# Patient Record
Sex: Male | Born: 1965 | Race: White | Hispanic: No | Marital: Married | Smoking: Former smoker
Health system: Southern US, Community
[De-identification: ages and names within clinical notes are randomized; demographics above are authoritative.]

## PROBLEM LIST (undated history)

## (undated) DIAGNOSIS — J302 Other seasonal allergic rhinitis: Secondary | ICD-10-CM

## (undated) DIAGNOSIS — T8859XA Other complications of anesthesia, initial encounter: Secondary | ICD-10-CM

## (undated) DIAGNOSIS — M1990 Unspecified osteoarthritis, unspecified site: Secondary | ICD-10-CM

## (undated) DIAGNOSIS — E119 Type 2 diabetes mellitus without complications: Secondary | ICD-10-CM

## (undated) DIAGNOSIS — T4145XA Adverse effect of unspecified anesthetic, initial encounter: Secondary | ICD-10-CM

## (undated) DIAGNOSIS — J45909 Unspecified asthma, uncomplicated: Secondary | ICD-10-CM

## (undated) DIAGNOSIS — M25512 Pain in left shoulder: Secondary | ICD-10-CM

## (undated) DIAGNOSIS — Z9889 Other specified postprocedural states: Secondary | ICD-10-CM

## (undated) DIAGNOSIS — K219 Gastro-esophageal reflux disease without esophagitis: Secondary | ICD-10-CM

## (undated) DIAGNOSIS — R112 Nausea with vomiting, unspecified: Secondary | ICD-10-CM

## (undated) HISTORY — PX: NASAL SEPTUM SURGERY: SHX37

## (undated) HISTORY — PX: KNEE ARTHROSCOPY: SUR90

---
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
## 2014-11-07 ENCOUNTER — Other Ambulatory Visit: Payer: Self-pay | Admitting: Physician Assistant

## 2014-11-07 ENCOUNTER — Encounter (HOSPITAL_BASED_OUTPATIENT_CLINIC_OR_DEPARTMENT_OTHER): Payer: Self-pay | Admitting: *Deleted

## 2014-11-07 NOTE — H&P (Signed)
Wesley Castillo is a new patient to the office.  Presents for a self solicited second opinion for issues with his left knee.  I have reviewed previous notes he has provided, as well as some x-rays he has provided.  I have no previous operative notes.  This is a healthy very active 49 year-old.  Presents for gradual worsening symptoms, left knee, over a number of years.  He had a traumatic injury with a remote medial meniscectomy on the left.  Subsequent development of significant progressive arthritis medial compartment.  He had another procedure mid 2014 with arthroscopy, debridement and microfracturing medial compartment.  That helped a little bit, but not very long.  He has now progressed with rest pain, night pain and marked functional impact.  Pain with all activities.  Increasing deformity.  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  He has tried an unloading brace which is just not tolerable at all.  He has been through Cortisone injections which have helped a little bit for a short time, but nothing more dramatic.  He has been told that he is going in the direction of joint replacement.  Although he has had numerous injections, I am not sure whether he has had Visco supplementation or not.  Oral anti-inflammatories do help to an extent.  A discussion with him has been undertaken about replacement as opposed to high tibial osteotomy.  He presents at this time for my input, treatment recommendation and to continue with his care.   Remaining history reviewed.  Otherwise in great health.  He has a little bit of soreness medial side of his right knee, but not that dramatic.     EXAMINATION: General exam is outlined and included in the chart.  Specifically, healthy appearing 49 year-old.  Height: 5?11.  Weight: 218 pounds.  Antalgic gait, varus thrust on the left.  Negative log roll of both hips.  Negative straight leg raise, both sides.  He has 2+ patellofemoral crepitus, both sides.  Reasonable Q angle and  not a lot of pain there.  On the right alignment is good.  No meniscal signs.  Ligaments stable.  On the left he has 5 degrees of varus that is correctable, but he still has full extension and 125 degrees of flexion.  All ligaments feel stable.  No tenderness.  No soreness laterally.  Neurovascularly intact distally.    X-RAYS: Four view standing x-ray reveals bone on bone medial compartment on the left.  A little bit of change patellofemoral joint, both sides, not too extreme.  Lateral compartment looks good on the left.  50% narrowing medial compartment on the right, certainly not bone on bone.  A unloading stress view of the left knee shows that the varus alignment and medial compartment can be opened.  The lateral side did not close.    DISPOSITION:  An extensive amount of time spent with him face-to-face concerning his dilemma, his age and options.  At this point in time I think the only viable option is replacement arthroplasty.  I am not at all in favor of a high tibial osteotomy in this setting.  I think he is a good candidate for a medial unicompartmental replacement rather than total knee replacement.  I discussed the pros and cons of both these approaches.  Procedure, risks, benefits and complications reviewed in detail.  A uni medial replacement can be done on an outpatient basis.  The amount of time for rehab and recovery outlined.  All  paperwork complete.  All questions answered.  If he decides to proceed I will see him at the time of operative intervention.    Loreta Ave, M.D.

## 2014-11-08 ENCOUNTER — Other Ambulatory Visit: Payer: Self-pay

## 2014-11-08 ENCOUNTER — Encounter (HOSPITAL_BASED_OUTPATIENT_CLINIC_OR_DEPARTMENT_OTHER)
Admission: RE | Admit: 2014-11-08 | Discharge: 2014-11-08 | Disposition: A | Discharge status: Home / Self Care | Payer: BLUE CROSS/BLUE SHIELD | Source: Ambulatory Visit | Attending: Orthopedic Surgery | Admitting: Orthopedic Surgery

## 2014-11-08 DIAGNOSIS — Z01812 Encounter for preprocedural laboratory examination: Secondary | ICD-10-CM | POA: Diagnosis present

## 2014-11-08 DIAGNOSIS — M179 Osteoarthritis of knee, unspecified: Secondary | ICD-10-CM | POA: Diagnosis not present

## 2014-11-08 DIAGNOSIS — Z01810 Encounter for preprocedural cardiovascular examination: Secondary | ICD-10-CM | POA: Insufficient documentation

## 2014-11-08 LAB — BASIC METABOLIC PANEL
Anion gap: 9 (ref 5–15)
BUN: 12 mg/dL (ref 6–20)
CALCIUM: 9.7 mg/dL (ref 8.9–10.3)
CHLORIDE: 101 mmol/L (ref 101–111)
CO2: 26 mmol/L (ref 22–32)
Creatinine, Ser: 1.08 mg/dL (ref 0.61–1.24)
Glucose, Bld: 282 mg/dL — ABNORMAL HIGH (ref 65–99)
Potassium: 4.2 mmol/L (ref 3.5–5.1)
Sodium: 136 mmol/L (ref 135–145)

## 2014-11-08 LAB — SURGICAL PCR SCREEN
MRSA, PCR: NEGATIVE
Staphylococcus aureus: POSITIVE — AB

## 2014-11-08 NOTE — Progress Notes (Signed)
Pt in for PAT labs, EKG and pcr screen.  Education done and supplies given to pt.  Will notify him only if screen is postitive.

## 2014-11-09 NOTE — Progress Notes (Signed)
Pt notified of pre op MRSA screen negative and staphylococcus aureus positive. Pt aware to use CHG wash night before and day of sx. Verbalized understanding.

## 2014-11-14 ENCOUNTER — Other Ambulatory Visit: Payer: Self-pay | Admitting: Physician Assistant

## 2014-11-14 NOTE — H&P (Signed)
Wesley Castillo is a new patient to the office.  Presents for a self solicited second opinion for issues with his left knee.  I have reviewed previous notes he has provided, as well as some x-rays he has provided.  I have no previous operative notes.  This is a healthy very active 49 year-old.  Presents for gradual worsening symptoms, left knee, over a number of years.  He had a traumatic injury with a remote medial meniscectomy on the left.  Subsequent development of significant progressive arthritis medial compartment.  He had another procedure mid 2014 with arthroscopy, debridement and microfracturing medial compartment.  That helped a little bit, but not very long.  He has now progressed with rest pain, night pain and marked functional impact.  Pain with all activities.  Increasing deformity.  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  He has tried an unloading brace which is just not tolerable at all.  He has been through Cortisone injections which have helped a little bit for a short time, but nothing more dramatic.  He has been told that he is going in the direction of joint replacement.  Although he has had numerous injections, I am not sure whether he has had Visco supplementation or not.  Oral anti-inflammatories do help to an extent.  A discussion with him has been undertaken about replacement as opposed to high tibial osteotomy.  He presents at this time for my input, treatment recommendation and to continue with his care.   Remaining history reviewed.  Otherwise in great health.  He has a little bit of soreness medial side of his right knee, but not that dramatic.     EXAMINATION: General exam is outlined and included in the chart.  Specifically, healthy appearing 49 year-old.  Height: 5?11.  Weight: 218 pounds.  Antalgic gait, varus thrust on the left.  Negative log roll of both hips.  Negative straight leg raise, both sides.  He has 2+ patellofemoral crepitus, both sides.  Reasonable Q angle and  not a lot of pain there.  On the right alignment is good.  No meniscal signs.  Ligaments stable.  On the left he has 5 degrees of varus that is correctable, but he still has full extension and 125 degrees of flexion.  All ligaments feel stable.  No tenderness.  No soreness laterally.  Neurovascularly intact distally.    X-RAYS: Four view standing x-ray reveals bone on bone medial compartment on the left.  A little bit of change patellofemoral joint, both sides, not too extreme.  Lateral compartment looks good on the left.  50% narrowing medial compartment on the right, certainly not bone on bone.  A unloading stress view of the left knee shows that the varus alignment and medial compartment can be opened.  The lateral side did not close.    DISPOSITION:  An extensive amount of time spent with him face-to-face concerning his dilemma, his age and options.  At this point in time I think the only viable option is replacement arthroplasty.  I am not at all in favor of a high tibial osteotomy in this setting.  I think he is a good candidate for a medial unicompartmental replacement rather than total knee replacement.  I discussed the pros and cons of both these approaches.  Procedure, risks, benefits and complications reviewed in detail.  A uni medial replacement can be done on an outpatient basis.  The amount of time for rehab and recovery outlined.  All  paperwork complete.  All questions answered.  If he decides to proceed I will see him at the time of operative intervention.    Loreta Ave, M.D.

## 2014-11-16 NOTE — H&P (View-Only) (Signed)
Wesley Castillo is a new patient to the office.  Presents for a self solicited second opinion for issues with his left knee.  I have reviewed previous notes he has provided, as well as some x-rays he has provided.  I have no previous operative notes.  This is a healthy very active 49 year-old.  Presents for gradual worsening symptoms, left knee, over a number of years.  He had a traumatic injury with a remote medial meniscectomy on the left.  Subsequent development of significant progressive arthritis medial compartment.  He had another procedure mid 2014 with arthroscopy, debridement and microfracturing medial compartment.  That helped a little bit, but not very long.  He has now progressed with rest pain, night pain and marked functional impact.  Pain with all activities.  Increasing deformity.  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  He has tried an unloading brace which is just not tolerable at all.  He has been through Cortisone injections which have helped a little bit for a short time, but nothing more dramatic.  He has been told that he is going in the direction of joint replacement.  Although he has had numerous injections, I am not sure whether he has had Visco supplementation or not.  Oral anti-inflammatories do help to an extent.  A discussion with him has been undertaken about replacement as opposed to high tibial osteotomy.  He presents at this time for my input, treatment recommendation and to continue with his care.   Remaining history reviewed.  Otherwise in great health.  He has a little bit of soreness medial side of his right knee, but not that dramatic.     EXAMINATION: General exam is outlined and included in the chart.  Specifically, healthy appearing 49 year-old.  Height: 5?11.  Weight: 218 pounds.  Antalgic gait, varus thrust on the left.  Negative log roll of both hips.  Negative straight leg raise, both sides.  He has 2+ patellofemoral crepitus, both sides.  Reasonable Q angle and  not a lot of pain there.  On the right alignment is good.  No meniscal signs.  Ligaments stable.  On the left he has 5 degrees of varus that is correctable, but he still has full extension and 125 degrees of flexion.  All ligaments feel stable.  No tenderness.  No soreness laterally.  Neurovascularly intact distally.    X-RAYS: Four view standing x-ray reveals bone on bone medial compartment on the left.  A little bit of change patellofemoral joint, both sides, not too extreme.  Lateral compartment looks good on the left.  50% narrowing medial compartment on the right, certainly not bone on bone.  A unloading stress view of the left knee shows that the varus alignment and medial compartment can be opened.  The lateral side did not close.    DISPOSITION:  An extensive amount of time spent with him face-to-face concerning his dilemma, his age and options.  At this point in time I think the only viable option is replacement arthroplasty.  I am not at all in favor of a high tibial osteotomy in this setting.  I think he is a good candidate for a medial unicompartmental replacement rather than total knee replacement.  I discussed the pros and cons of both these approaches.  Procedure, risks, benefits and complications reviewed in detail.  A uni medial replacement can be done on an outpatient basis.  The amount of time for rehab and recovery outlined.  All  paperwork complete.  All questions answered.  If he decides to proceed I will see him at the time of operative intervention.    Loreta Ave, M.D.

## 2014-11-16 NOTE — Interval H&P Note (Signed)
History and Physical Interval Note:  11/16/2014 8:32 AM  Wesley Castillo  has presented today for surgery, with the diagnosis of UNILATERAL PRIMARY OSTEOARTHRITIS LEFT KNEE   The various methods of treatment have been discussed with the patient and family. After consideration of risks, benefits and other options for treatment, the patient has consented to  Procedure(s): LEFT KNEE UNI ARTHROPLASTY  (Left) as a surgical intervention .  The patient's history has been reviewed, patient examined, no change in status, stable for surgery.  I have reviewed the patient's chart and labs.  Questions were answered to the patient's satisfaction.     Loreta Ave

## 2014-11-17 ENCOUNTER — Ambulatory Visit (HOSPITAL_BASED_OUTPATIENT_CLINIC_OR_DEPARTMENT_OTHER): Payer: BLUE CROSS/BLUE SHIELD | Admitting: Anesthesiology

## 2014-11-17 ENCOUNTER — Ambulatory Visit (HOSPITAL_BASED_OUTPATIENT_CLINIC_OR_DEPARTMENT_OTHER)
Admission: RE | Admit: 2014-11-17 | Discharge: 2014-11-18 | Disposition: A | Discharge status: Home / Self Care | Payer: BLUE CROSS/BLUE SHIELD | Source: Ambulatory Visit | Attending: Orthopedic Surgery | Admitting: Orthopedic Surgery

## 2014-11-17 ENCOUNTER — Encounter (HOSPITAL_BASED_OUTPATIENT_CLINIC_OR_DEPARTMENT_OTHER): Payer: Self-pay | Admitting: *Deleted

## 2014-11-17 ENCOUNTER — Encounter (HOSPITAL_BASED_OUTPATIENT_CLINIC_OR_DEPARTMENT_OTHER)
Admission: RE | Disposition: A | Discharge status: Home / Self Care | Payer: Self-pay | Source: Ambulatory Visit | Attending: Orthopedic Surgery

## 2014-11-17 DIAGNOSIS — F17200 Nicotine dependence, unspecified, uncomplicated: Secondary | ICD-10-CM | POA: Insufficient documentation

## 2014-11-17 DIAGNOSIS — M1990 Unspecified osteoarthritis, unspecified site: Secondary | ICD-10-CM | POA: Diagnosis present

## 2014-11-17 DIAGNOSIS — M13862 Other specified arthritis, left knee: Secondary | ICD-10-CM | POA: Insufficient documentation

## 2014-11-17 DIAGNOSIS — Z96652 Presence of left artificial knee joint: Secondary | ICD-10-CM

## 2014-11-17 DIAGNOSIS — E119 Type 2 diabetes mellitus without complications: Secondary | ICD-10-CM | POA: Diagnosis not present

## 2014-11-17 HISTORY — DX: Other specified postprocedural states: Z98.890

## 2014-11-17 HISTORY — DX: Nausea with vomiting, unspecified: R11.2

## 2014-11-17 HISTORY — DX: Unspecified osteoarthritis, unspecified site: M19.90

## 2014-11-17 HISTORY — DX: Type 2 diabetes mellitus without complications: E11.9

## 2014-11-17 HISTORY — PX: PARTIAL KNEE ARTHROPLASTY: SHX2174

## 2014-11-17 HISTORY — DX: Other seasonal allergic rhinitis: J30.2

## 2014-11-17 LAB — GLUCOSE, CAPILLARY
GLUCOSE-CAPILLARY: 295 mg/dL — AB (ref 65–99)
GLUCOSE-CAPILLARY: 266 mg/dL — AB (ref 65–99)
Glucose-Capillary: 192 mg/dL — ABNORMAL HIGH (ref 65–99)
Glucose-Capillary: 228 mg/dL — ABNORMAL HIGH (ref 65–99)
Glucose-Capillary: 297 mg/dL — ABNORMAL HIGH (ref 65–99)

## 2014-11-17 LAB — POCT HEMOGLOBIN-HEMACUE: Hemoglobin: 17.1 g/dL — ABNORMAL HIGH (ref 13.0–17.0)

## 2014-11-17 SURGERY — ARTHROPLASTY, KNEE, UNICOMPARTMENTAL
Anesthesia: General | Site: Knee | Laterality: Left

## 2014-11-17 MED ORDER — CEFAZOLIN SODIUM-DEXTROSE 2-3 GM-% IV SOLR
2.0000 g | Freq: Four times a day (QID) | INTRAVENOUS | Status: AC
  Administered 2014-11-17 – 2014-11-18 (×3): 2 g via INTRAVENOUS
  Filled 2014-11-17 (×3): qty 50

## 2014-11-17 MED ORDER — METHOCARBAMOL 500 MG PO TABS: 500.0000 mg | ORAL_TABLET | Freq: Four times a day (QID) | ORAL | Status: DC

## 2014-11-17 MED ORDER — ONDANSETRON HCL 4 MG PO TABS: 4.0000 mg | ORAL_TABLET | Freq: Three times a day (TID) | ORAL | Status: DC | PRN

## 2014-11-17 MED ORDER — LACTATED RINGERS IV SOLN: INTRAVENOUS | Status: DC

## 2014-11-17 MED ORDER — HYDROCODONE-ACETAMINOPHEN 5-325 MG PO TABS
1.0000 | ORAL_TABLET | ORAL | Status: DC | PRN
  Administered 2014-11-17 – 2014-11-18 (×4): 2 via ORAL
  Filled 2014-11-17 (×4): qty 2

## 2014-11-17 MED ORDER — ONDANSETRON HCL 4 MG PO TABS: 4.0000 mg | ORAL_TABLET | Freq: Four times a day (QID) | ORAL | Status: DC | PRN

## 2014-11-17 MED ORDER — MORPHINE SULFATE 10 MG/ML IJ SOLN
INTRAMUSCULAR | Status: DC | PRN
  Administered 2014-11-17 (×4): 2 mg via INTRAVENOUS

## 2014-11-17 MED ORDER — METOCLOPRAMIDE HCL 5 MG/ML IJ SOLN: 5.0000 mg | Freq: Three times a day (TID) | INTRAMUSCULAR | Status: DC | PRN

## 2014-11-17 MED ORDER — ONDANSETRON HCL 4 MG/2ML IJ SOLN
4.0000 mg | Freq: Four times a day (QID) | INTRAMUSCULAR | Status: DC | PRN
  Administered 2014-11-17: 4 mg via INTRAVENOUS

## 2014-11-17 MED ORDER — BISACODYL 5 MG PO TBEC: 5.0000 mg | DELAYED_RELEASE_TABLET | Freq: Every day | ORAL | Status: DC | PRN

## 2014-11-17 MED ORDER — POLYETHYLENE GLYCOL 3350 17 G PO PACK: 17.0000 g | PACK | Freq: Every day | ORAL | Status: DC | PRN

## 2014-11-17 MED ORDER — BUPIVACAINE LIPOSOME 1.3 % IJ SUSP
INTRAMUSCULAR | Status: DC | PRN
  Administered 2014-11-17: 20 mL

## 2014-11-17 MED ORDER — METOCLOPRAMIDE HCL 5 MG PO TABS: 5.0000 mg | ORAL_TABLET | Freq: Three times a day (TID) | ORAL | Status: DC | PRN

## 2014-11-17 MED ORDER — HYDROCODONE-ACETAMINOPHEN 7.5-325 MG PO TABS: 1.0000 | ORAL_TABLET | ORAL | Status: DC | PRN

## 2014-11-17 MED ORDER — FENTANYL CITRATE (PF) 100 MCG/2ML IJ SOLN
INTRAMUSCULAR | Status: AC
  Filled 2014-11-17: qty 2

## 2014-11-17 MED ORDER — BUPIVACAINE LIPOSOME 1.3 % IJ SUSP
INTRAMUSCULAR | Status: AC
  Filled 2014-11-17: qty 20

## 2014-11-17 MED ORDER — INSULIN ASPART 100 UNIT/ML ~~LOC~~ SOLN
SUBCUTANEOUS | Status: AC
  Filled 2014-11-17: qty 1

## 2014-11-17 MED ORDER — INSULIN ASPART 100 UNIT/ML ~~LOC~~ SOLN
0.0000 [IU] | Freq: Three times a day (TID) | SUBCUTANEOUS | Status: DC
  Administered 2014-11-17 – 2014-11-18 (×2): 5 [IU] via SUBCUTANEOUS
  Filled 2014-11-17 (×2): qty 1

## 2014-11-17 MED ORDER — HYDROMORPHONE HCL 1 MG/ML IJ SOLN: 0.5000 mg | INTRAMUSCULAR | Status: DC | PRN

## 2014-11-17 MED ORDER — CEFAZOLIN SODIUM-DEXTROSE 2-3 GM-% IV SOLR
INTRAVENOUS | Status: AC
  Filled 2014-11-17: qty 50

## 2014-11-17 MED ORDER — ZOLPIDEM TARTRATE 5 MG PO TABS: 5.0000 mg | ORAL_TABLET | Freq: Every evening | ORAL | Status: DC | PRN

## 2014-11-17 MED ORDER — CHLORHEXIDINE GLUCONATE 4 % EX LIQD: 60.0000 mL | Freq: Once | CUTANEOUS | Status: DC

## 2014-11-17 MED ORDER — GLIPIZIDE 5 MG PO TABS: 5.0000 mg | ORAL_TABLET | Freq: Every day | ORAL | Status: DC

## 2014-11-17 MED ORDER — GLYCOPYRROLATE 0.2 MG/ML IJ SOLN: 0.2000 mg | Freq: Once | INTRAMUSCULAR | Status: DC | PRN

## 2014-11-17 MED ORDER — CEFAZOLIN SODIUM-DEXTROSE 2-3 GM-% IV SOLR
2.0000 g | INTRAVENOUS | Status: AC
  Administered 2014-11-17: 2 g via INTRAVENOUS

## 2014-11-17 MED ORDER — METFORMIN HCL 500 MG PO TABS
1000.0000 mg | ORAL_TABLET | Freq: Two times a day (BID) | ORAL | Status: DC
  Administered 2014-11-17: 1000 mg via ORAL

## 2014-11-17 MED ORDER — OXYCODONE-ACETAMINOPHEN 5-325 MG PO TABS: 1.0000 | ORAL_TABLET | ORAL | Status: DC | PRN

## 2014-11-17 MED ORDER — APIXABAN 2.5 MG PO TABS: ORAL_TABLET | ORAL | Status: DC

## 2014-11-17 MED ORDER — SCOPOLAMINE 1 MG/3DAYS TD PT72: 1.0000 | MEDICATED_PATCH | Freq: Once | TRANSDERMAL | Status: DC | PRN

## 2014-11-17 MED ORDER — HYDROMORPHONE HCL 2 MG PO TABS: 2.0000 mg | ORAL_TABLET | ORAL | Status: DC | PRN

## 2014-11-17 MED ORDER — MIDAZOLAM HCL 2 MG/2ML IJ SOLN
INTRAMUSCULAR | Status: AC
  Filled 2014-11-17: qty 2

## 2014-11-17 MED ORDER — DEXAMETHASONE SODIUM PHOSPHATE 4 MG/ML IJ SOLN
INTRAMUSCULAR | Status: DC | PRN
  Administered 2014-11-17: 10 mg via INTRAVENOUS

## 2014-11-17 MED ORDER — DOCUSATE SODIUM 100 MG PO CAPS: 100.0000 mg | ORAL_CAPSULE | Freq: Two times a day (BID) | ORAL | Status: DC

## 2014-11-17 MED ORDER — MAGNESIUM CITRATE PO SOLN: 1.0000 | Freq: Once | ORAL | Status: AC | PRN

## 2014-11-17 MED ORDER — PROPOFOL 10 MG/ML IV BOLUS
INTRAVENOUS | Status: DC | PRN
  Administered 2014-11-17: 250 mg via INTRAVENOUS

## 2014-11-17 MED ORDER — METHOCARBAMOL 1000 MG/10ML IJ SOLN: 500.0000 mg | Freq: Four times a day (QID) | INTRAVENOUS | Status: DC | PRN

## 2014-11-17 MED ORDER — METHOCARBAMOL 500 MG PO TABS
500.0000 mg | ORAL_TABLET | Freq: Four times a day (QID) | ORAL | Status: DC | PRN
  Administered 2014-11-17 – 2014-11-18 (×2): 500 mg via ORAL
  Filled 2014-11-17 (×2): qty 1

## 2014-11-17 MED ORDER — MIDAZOLAM HCL 2 MG/2ML IJ SOLN
1.0000 mg | INTRAMUSCULAR | Status: DC | PRN
Start: 2014-11-17 — End: 2014-11-17
  Administered 2014-11-17: 2 mg via INTRAVENOUS

## 2014-11-17 MED ORDER — LACTATED RINGERS IV SOLN
INTRAVENOUS | Status: DC
  Administered 2014-11-17: 09:00:00 via INTRAVENOUS

## 2014-11-17 MED ORDER — FENTANYL CITRATE (PF) 100 MCG/2ML IJ SOLN
50.0000 ug | INTRAMUSCULAR | Status: DC | PRN
  Administered 2014-11-17: 100 ug via INTRAVENOUS

## 2014-11-17 MED ORDER — LIDOCAINE HCL (CARDIAC) 20 MG/ML IV SOLN
INTRAVENOUS | Status: DC | PRN
  Administered 2014-11-17: 50 mg via INTRAVENOUS

## 2014-11-17 MED ORDER — POTASSIUM CHLORIDE IN NACL 20-0.9 MEQ/L-% IV SOLN
INTRAVENOUS | Status: DC
  Administered 2014-11-17: 15:00:00 via INTRAVENOUS
  Filled 2014-11-17: qty 1000

## 2014-11-17 MED ORDER — MORPHINE SULFATE 10 MG/ML IJ SOLN
INTRAMUSCULAR | Status: AC
  Filled 2014-11-17: qty 1

## 2014-11-17 MED ORDER — SODIUM CHLORIDE 0.9 % IR SOLN
Status: DC | PRN
  Administered 2014-11-17: 4500 mL

## 2014-11-17 MED ORDER — FENTANYL CITRATE (PF) 100 MCG/2ML IJ SOLN
25.0000 ug | INTRAMUSCULAR | Status: DC | PRN
  Administered 2014-11-17: 50 ug via INTRAVENOUS

## 2014-11-17 MED ORDER — ONDANSETRON HCL 4 MG/2ML IJ SOLN
4.0000 mg | Freq: Four times a day (QID) | INTRAMUSCULAR | Status: DC | PRN
  Filled 2014-11-17: qty 2

## 2014-11-17 MED ORDER — APIXABAN 2.5 MG PO TABS: 2.5000 mg | ORAL_TABLET | Freq: Two times a day (BID) | ORAL | Status: DC

## 2014-11-17 MED ORDER — FENTANYL CITRATE (PF) 100 MCG/2ML IJ SOLN
25.0000 ug | INTRAMUSCULAR | Status: DC | PRN
  Administered 2014-11-17 (×3): 50 ug via INTRAVENOUS

## 2014-11-17 SURGICAL SUPPLY — 80 items
BAG DECANTER FOR FLEXI CONT (MISCELLANEOUS) ×3 IMPLANT
BANDAGE ELASTIC 4 VELCRO ST LF (GAUZE/BANDAGES/DRESSINGS) ×3 IMPLANT
BANDAGE ELASTIC 6 VELCRO ST LF (GAUZE/BANDAGES/DRESSINGS) ×3 IMPLANT
BANDAGE ESMARK 6X9 LF (GAUZE/BANDAGES/DRESSINGS) ×1 IMPLANT
BENZOIN TINCTURE PRP APPL 2/3 (GAUZE/BANDAGES/DRESSINGS) ×3 IMPLANT
BLADE SAW RECIP 87.9 MT (BLADE) ×3 IMPLANT
BLADE SAW SGTL 13.0X1.19X90.0M (BLADE) ×3 IMPLANT
BLADE SURG 10 STRL SS (BLADE) ×3 IMPLANT
BLADE SURG 15 STRL LF DISP TIS (BLADE) ×2 IMPLANT
BLADE SURG 15 STRL SS (BLADE) ×4
BNDG ESMARK 6X9 LF (GAUZE/BANDAGES/DRESSINGS) ×3
BOWL SMART MIX CTS (DISPOSABLE) ×3 IMPLANT
CAPT KNEE PARTIAL 2 ×3 IMPLANT
CEMENT BONE SIMPLEX SPEEDSET (Cement) ×6 IMPLANT
CLOSURE WOUND 1/2 X4 (GAUZE/BANDAGES/DRESSINGS) ×1
COVER BACK TABLE 60X90IN (DRAPES) ×3 IMPLANT
CUFF TOURNIQUET SINGLE 34IN LL (TOURNIQUET CUFF) ×3 IMPLANT
DECANTER SPIKE VIAL GLASS SM (MISCELLANEOUS) IMPLANT
DRAPE EXTREMITY T 121X128X90 (DRAPE) ×3 IMPLANT
DRAPE INCISE IOBAN 66X45 STRL (DRAPES) ×3 IMPLANT
DRAPE U 20/CS (DRAPES) ×6 IMPLANT
DRAPE U-SHAPE 47X51 STRL (DRAPES) ×3 IMPLANT
DURAPREP 26ML APPLICATOR (WOUND CARE) ×3 IMPLANT
ELECT REM PT RETURN 9FT ADLT (ELECTROSURGICAL) ×3
ELECTRODE REM PT RTRN 9FT ADLT (ELECTROSURGICAL) ×1 IMPLANT
FACESHIELD WRAPAROUND (MASK) ×3 IMPLANT
GAUZE SPONGE 4X4 12PLY STRL (GAUZE/BANDAGES/DRESSINGS) ×3 IMPLANT
GAUZE XEROFORM 1X8 LF (GAUZE/BANDAGES/DRESSINGS) ×3 IMPLANT
GLOVE BIO SURGEON STRL SZ 6.5 (GLOVE) ×2 IMPLANT
GLOVE BIO SURGEONS STRL SZ 6.5 (GLOVE) ×1
GLOVE BIOGEL PI IND STRL 7.0 (GLOVE) ×5 IMPLANT
GLOVE BIOGEL PI INDICATOR 7.0 (GLOVE) ×10
GLOVE ECLIPSE 6.5 STRL STRAW (GLOVE) ×3 IMPLANT
GLOVE ECLIPSE 7.0 STRL STRAW (GLOVE) ×3 IMPLANT
GLOVE ORTHO TXT STRL SZ7.5 (GLOVE) IMPLANT
GLOVE SURG ORTHO 8.0 STRL STRW (GLOVE) ×3 IMPLANT
GOWN STRL REUS W/ TWL LRG LVL3 (GOWN DISPOSABLE) ×3 IMPLANT
GOWN STRL REUS W/ TWL XL LVL3 (GOWN DISPOSABLE) ×1 IMPLANT
GOWN STRL REUS W/TWL LRG LVL3 (GOWN DISPOSABLE) ×6
GOWN STRL REUS W/TWL XL LVL3 (GOWN DISPOSABLE) ×2
HANDPIECE INTERPULSE COAX TIP (DISPOSABLE) ×2
IMMOBILIZER KNEE 22 UNIV (SOFTGOODS) IMPLANT
IMMOBILIZER KNEE 24 THIGH 36 (MISCELLANEOUS) ×1 IMPLANT
IMMOBILIZER KNEE 24 UNIV (MISCELLANEOUS) ×3
IV NS IRRIG 3000ML ARTHROMATIC (IV SOLUTION) ×3 IMPLANT
KNEE CAPITATED PARTIAL 2 ×1 IMPLANT
KNEE WRAP E Z 3 GEL PACK (MISCELLANEOUS) ×3 IMPLANT
MANIFOLD NEPTUNE II (INSTRUMENTS) ×3 IMPLANT
NDL SAFETY ECLIPSE 18X1.5 (NEEDLE) ×1 IMPLANT
NEEDLE HYPO 18GX1.5 SHARP (NEEDLE) ×2
NS IRRIG 1000ML POUR BTL (IV SOLUTION) ×3 IMPLANT
PACK ARTHROSCOPY DSU (CUSTOM PROCEDURE TRAY) ×3 IMPLANT
PACK BASIN DAY SURGERY FS (CUSTOM PROCEDURE TRAY) ×3 IMPLANT
PADDING CAST COTTON 6X4 STRL (CAST SUPPLIES) ×3 IMPLANT
PENCIL BUTTON HOLSTER BLD 10FT (ELECTRODE) ×3 IMPLANT
SET HNDPC FAN SPRY TIP SCT (DISPOSABLE) ×1 IMPLANT
SHEET MEDIUM DRAPE 40X70 STRL (DRAPES) IMPLANT
SLEEVE SCD COMPRESS KNEE MED (MISCELLANEOUS) ×3 IMPLANT
SLEEVE SURGEON STRL (DRAPES) ×3 IMPLANT
SPONGE LAP 18X18 X RAY DECT (DISPOSABLE) ×3 IMPLANT
STAPLER VISISTAT 35W (STAPLE) IMPLANT
STRIP CLOSURE SKIN 1/2X4 (GAUZE/BANDAGES/DRESSINGS) ×2 IMPLANT
SUCTION FRAZIER TIP 10 FR DISP (SUCTIONS) IMPLANT
SUT FIBERWIRE #2 38 T-5 BLUE (SUTURE)
SUT MNCRL AB 4-0 PS2 18 (SUTURE) IMPLANT
SUT VIC AB 0 CT1 27 (SUTURE)
SUT VIC AB 0 CT1 27XBRD ANBCTR (SUTURE) IMPLANT
SUT VIC AB 0 SH 27 (SUTURE) IMPLANT
SUT VIC AB 1 CT1 27 (SUTURE) ×2
SUT VIC AB 1 CT1 27XBRD ANBCTR (SUTURE) ×1 IMPLANT
SUT VIC AB 2-0 SH 27 (SUTURE) ×4
SUT VIC AB 2-0 SH 27XBRD (SUTURE) ×2 IMPLANT
SUTURE FIBERWR #2 38 T-5 BLUE (SUTURE) IMPLANT
SYR 50ML LL SCALE MARK (SYRINGE) ×3 IMPLANT
SYR BULB 3OZ (MISCELLANEOUS) ×3 IMPLANT
TOWEL OR 17X24 6PK STRL BLUE (TOWEL DISPOSABLE) ×3 IMPLANT
TOWEL OR NON WOVEN STRL DISP B (DISPOSABLE) ×6 IMPLANT
TUBE CONNECTING 20'X1/4 (TUBING) ×1
TUBE CONNECTING 20X1/4 (TUBING) ×2 IMPLANT
YANKAUER SUCT BULB TIP NO VENT (SUCTIONS) ×3 IMPLANT

## 2014-11-17 NOTE — Progress Notes (Signed)
AssistedDr. Edwards with left, ultrasound guided, adductor canal block. Side rails up, monitors on throughout procedure. See vital signs in flow sheet. Tolerated Procedure well.  

## 2014-11-17 NOTE — Interval H&P Note (Signed)
History and Physical Interval Note:  11/17/2014 9:44 AM  Wesley Castillo  has presented today for surgery, with the diagnosis of UNILATERAL PRIMARY OSTEOARTHRITIS LEFT KNEE   The various methods of treatment have been discussed with the patient and family. After consideration of risks, benefits and other options for treatment, the patient has consented to  Procedure(s): LEFT KNEE UNI ARTHROPLASTY  (Left) as a surgical intervention .  The patient's history has been reviewed, patient examined, no change in status, stable for surgery.  I have reviewed the patient's chart and labs.  Questions were answered to the patient's satisfaction.     Loreta Ave

## 2014-11-17 NOTE — Transfer of Care (Signed)
Immediate Anesthesia Transfer of Care Note  Patient: Wesley Castillo  Procedure(s) Performed: Procedure(s): LEFT KNEE UNI ARTHROPLASTY  (Left)  Patient Location: PACU  Anesthesia Type:General  Level of Consciousness: awake and sedated  Airway & Oxygen Therapy: Patient Spontanous Breathing and Patient connected to face mask oxygen  Post-op Assessment: Report given to RN and Post -op Vital signs reviewed and stable  Post vital signs: Reviewed and stable  Last Vitals:  Filed Vitals:   11/17/14 1025  BP: 131/78  Pulse: 69  Temp:   Resp: 15    Complications: No apparent anesthesia complications

## 2014-11-17 NOTE — Anesthesia Procedure Notes (Addendum)
Anesthesia Regional Block:  Adductor canal block  Pre-Anesthetic Checklist: ,, timeout performed, Correct Patient, Correct Site, Correct Laterality, Correct Procedure, Correct Position, site marked, Risks and benefits discussed, Surgical consent,  Pre-op evaluation,  At surgeon's request  Laterality: Left  Prep: chloraprep       Needles:   Needle Type: Stimulator Needle - 40          Additional Needles:  Procedures: Doppler guided, ultrasound guided (picture in chart) and nerve stimulator Adductor canal block Narrative:  Start time: 11/17/2014 10:15 AM End time: 11/17/2014 10:30 AM Injection made incrementally with aspirations every 5 mL.  Performed by: Personally  Anesthesiologist: Judie Petit   Procedure Name: LMA Insertion Performed by: York Grice Pre-anesthesia Checklist: Patient identified, Timeout performed, Emergency Drugs available, Suction available and Patient being monitored Patient Re-evaluated:Patient Re-evaluated prior to inductionOxygen Delivery Method: Circle system utilized Preoxygenation: Pre-oxygenation with 100% oxygen Intubation Type: IV induction Ventilation: Mask ventilation without difficulty LMA: LMA inserted LMA Size: 5.0 Tube type: Oral Number of attempts: 1 Placement Confirmation: positive ETCO2 and breath sounds checked- equal and bilateral Tube secured with: Tape Dental Injury: Teeth and Oropharynx as per pre-operative assessment

## 2014-11-17 NOTE — Anesthesia Postprocedure Evaluation (Signed)
  Anesthesia Post-op Note  Patient: Wesley Castillo  Procedure(s) Performed: Procedure(s): LEFT KNEE UNI ARTHROPLASTY  (Left)  Patient Location: PACU  Anesthesia Type:General  Level of Consciousness: awake  Airway and Oxygen Therapy: Patient Spontanous Breathing  Post-op Pain: mild  Post-op Assessment: Post-op Vital signs reviewed LLE Motor Response: Purposeful movement LLE Sensation: Pain          Post-op Vital Signs: Reviewed  Last Vitals:  Filed Vitals:   11/17/14 1645  BP: 140/89  Pulse: 83  Temp:   Resp:     Complications: No apparent anesthesia complications

## 2014-11-17 NOTE — Discharge Instructions (Signed)
INSTRUCTIONS AFTER JOINT REPLACEMENT   o Remove items at home which could result in a fall. This includes throw rugs or furniture in walking pathways o ICE to the affected joint every three hours while awake for 30 minutes at a time, for at least the first 3-5 days, and then as needed for pain and swelling.  Continue to use ice for pain and swelling. You may notice swelling that will progress down to the foot and ankle.  This is normal after surgery.  Elevate your leg when you are not up walking on it.   o Continue to use the breathing machine you got in the hospital (incentive spirometer) which will help keep your temperature down.  It is common for your temperature to cycle up and down following surgery, especially at night when you are not up moving around and exerting yourself.  The breathing machine keeps your lungs expanded and your temperature down.  BLOOD THINNER: TAKE ELIQUIS AS DIRECTED FOR A TOTAL OF 14 DAYS FOLLOWING SURGERY.  ONCE FINISHED WITH ELIQUIS, TAKE ASPIRIN 325 MG ONE TAB DAILY.  BOTH OF THESE MEDICATIONS ARE USED TO PREVENT BLOOD CLOTS.  DIET:  As you were doing prior to hospitalization, we recommend a well-balanced diet.  DRESSING / WOUND CARE / SHOWERING  You may change your dressing 3-5 days after surgery.  Then change the dressing every day with sterile gauze.  Please use good hand washing techniques before changing the dressing.  Do not use any lotions or creams on the incision until instructed by your surgeon. and You may shower 3 days after surgery, but keep the wounds dry during showering.  You may use an occlusive plastic wrap (Press'n Seal for example), NO SOAKING/SUBMERGING IN THE BATHTUB.  If the bandage gets wet, change with a clean dry gauze.  If the incision gets wet, pat the wound dry with a clean towel.  ACTIVITY  o Increase activity slowly as tolerated, but follow the weight bearing instructions below.   o No driving for 6 weeks or until further direction  given by your physician.  You cannot drive while taking narcotics.  o No lifting or carrying greater than 10 lbs. until further directed by your surgeon. o Avoid periods of inactivity such as sitting longer than an hour when not asleep. This helps prevent blood clots.  o You may return to work once you are authorized by your doctor.     WEIGHT BEARING   Weight bearing as tolerated with assist device (walker, cane, etc) as directed, use it as long as suggested by your surgeon or therapist, typically at least 4-6 weeks.   EXERCISES  Results after joint replacement surgery are often greatly improved when you follow the exercise, range of motion and muscle strengthening exercises prescribed by your doctor. Safety measures are also important to protect the joint from further injury. Any time any of these exercises cause you to have increased pain or swelling, decrease what you are doing until you are comfortable again and then slowly increase them. If you have problems or questions, call your caregiver or physical therapist for advice.   Rehabilitation is important following a joint replacement. After just a few days of immobilization, the muscles of the leg can become weakened and shrink (atrophy).  These exercises are designed to build up the tone and strength of the thigh and leg muscles and to improve motion. Often times heat used for twenty to thirty minutes before working out will loosen up your  tissues and help with improving the range of motion but do not use heat for the first two weeks following surgery (sometimes heat can increase post-operative swelling).   These exercises can be done on a training (exercise) mat, on the floor, on a table or on a bed. Use whatever works the best and is most comfortable for you.    Use music or television while you are exercising so that the exercises are a pleasant break in your day. This will make your life better with the exercises acting as a break in  your routine that you can look forward to.   Perform all exercises about fifteen times, three times per day or as directed.  You should exercise both the operative leg and the other leg as well.  Exercises include:    Quad Sets - Tighten up the muscle on the front of the thigh (Quad) and hold for 5-10 seconds.    Straight Leg Raises - With your knee straight (if you were given a brace, keep it on), lift the leg to 60 degrees, hold for 3 seconds, and slowly lower the leg.  Perform this exercise against resistance later as your leg gets stronger.   Leg Slides: Lying on your back, slowly slide your foot toward your buttocks, bending your knee up off the floor (only go as far as is comfortable). Then slowly slide your foot back down until your leg is flat on the floor again.   Angel Wings: Lying on your back spread your legs to the side as far apart as you can without causing discomfort.   Hamstring Strength:  Lying on your back, push your heel against the floor with your leg straight by tightening up the muscles of your buttocks.  Repeat, but this time bend your knee to a comfortable angle, and push your heel against the floor.  You may put a pillow under the heel to make it more comfortable if necessary.   A rehabilitation program following joint replacement surgery can speed recovery and prevent re-injury in the future due to weakened muscles. Contact your doctor or a physical therapist for more information on knee rehabilitation.    CONSTIPATION  Constipation is defined medically as fewer than three stools per week and severe constipation as less than one stool per week.  Even if you have a regular bowel pattern at home, your normal regimen is likely to be disrupted due to multiple reasons following surgery.  Combination of anesthesia, postoperative narcotics, change in appetite and fluid intake all can affect your bowels.   YOU MUST use at least one of the following options; they are listed in  order of increasing strength to get the job done.  They are all available over the counter, and you may need to use some, POSSIBLY even all of these options:    Drink plenty of fluids (prune juice may be helpful) and high fiber foods Colace 100 mg by mouth twice a day  Senokot for constipation as directed and as needed Dulcolax (bisacodyl), take with full glass of water  Miralax (polyethylene glycol) once or twice a day as needed.  If you have tried all these things and are unable to have a bowel movement in the first 3-4 days after surgery call either your surgeon or your primary doctor.    If you experience loose stools or diarrhea, hold the medications until you stool forms back up.  If your symptoms do not get better within 1 week or  if they get worse, check with your doctor.  If you experience "the worst abdominal pain ever" or develop nausea or vomiting, please contact the office immediately for further recommendations for treatment.   ITCHING:  If you experience itching with your medications, try taking only a single pain pill, or even half a pain pill at a time.  You can also use Benadryl over the counter for itching or also to help with sleep.   TED HOSE STOCKINGS:  Use stockings on both legs until for at least 2 weeks or as directed by physician office. They may be removed at night for sleeping.  MEDICATIONS:  See your medication summary on the After Visit Summary that nursing will review with you.  You may have some home medications which will be placed on hold until you complete the course of blood thinner medication.  It is important for you to complete the blood thinner medication as prescribed.  PRECAUTIONS:  If you experience chest pain or shortness of breath - call 911 immediately for transfer to the hospital emergency department.   If you develop a fever greater that 101 F, purulent drainage from wound, increased redness or drainage from wound, foul odor from the  wound/dressing, or calf pain - CONTACT YOUR SURGEON.                                                   FOLLOW-UP APPOINTMENTS:  If you do not already have a post-op appointment, please call the office for an appointment to be seen by your surgeon.  Guidelines for how soon to be seen are listed in your After Visit Summary, but are typically between 1-4 weeks after surgery.  OTHER INSTRUCTIONS:   Knee Replacement:  Do not place pillow under knee, focus on keeping the knee straight while resting. CPM instructions: 0-90 degrees, 2 hours in the morning, 2 hours in the afternoon, and 2 hours in the evening. Place foam block, curve side up under heel at all times except when in CPM or when walking.  DO NOT modify, tear, cut, or change the foam block in any way.  MAKE SURE YOU:   Understand these instructions.   Get help right away if you are not doing well or get worse.    Thank you for letting us be a part of your medical care team.  It is a privilege we respect greatly.  We hope these instructions will help you stay on track for a fast and full recovery!

## 2014-11-17 NOTE — Anesthesia Preprocedure Evaluation (Signed)
Anesthesia Evaluation  Patient identified by MRN, date of birth, ID band Patient awake    Reviewed: Allergy & Precautions, NPO status , Patient's Chart, lab work & pertinent test results  History of Anesthesia Complications (+) PONV  Airway Mallampati: II  TM Distance: >3 FB Neck ROM: Full    Dental   Pulmonary Current Smoker,  breath sounds clear to auscultation        Cardiovascular Rhythm:Regular Rate:Normal     Neuro/Psych    GI/Hepatic negative GI ROS, Neg liver ROS,   Endo/Other  diabetes  Renal/GU negative Renal ROS     Musculoskeletal   Abdominal   Peds  Hematology   Anesthesia Other Findings   Reproductive/Obstetrics                             Anesthesia Physical Anesthesia Plan  ASA: III  Anesthesia Plan: General   Post-op Pain Management:    Induction: Intravenous  Airway Management Planned: LMA  Additional Equipment:   Intra-op Plan:   Post-operative Plan: Extubation in OR  Informed Consent: I have reviewed the patients History and Physical, chart, labs and discussed the procedure including the risks, benefits and alternatives for the proposed anesthesia with the patient or authorized representative who has indicated his/her understanding and acceptance.   Dental advisory given  Plan Discussed with: CRNA and Anesthesiologist  Anesthesia Plan Comments:         Anesthesia Quick Evaluation

## 2014-11-18 ENCOUNTER — Encounter (HOSPITAL_BASED_OUTPATIENT_CLINIC_OR_DEPARTMENT_OTHER): Payer: Self-pay | Admitting: Orthopedic Surgery

## 2014-11-18 DIAGNOSIS — M13862 Other specified arthritis, left knee: Secondary | ICD-10-CM | POA: Diagnosis not present

## 2014-11-18 LAB — GLUCOSE, CAPILLARY: GLUCOSE-CAPILLARY: 286 mg/dL — AB (ref 65–99)

## 2014-11-18 NOTE — Op Note (Signed)
Wesley Castillo, SALINGER             ACCOUNT NO.:  [id number]  MEDICAL RECORD NO.:  [id number]  LOCATION:                               FACILITY:  [facility]  PHYSICIAN:  Loreta Ave, M.D. DATE OF BIRTH:  08-May-1965  DATE OF PROCEDURE:  11/17/2014 DATE OF DISCHARGE:                              OPERATIVE REPORT   PREOPERATIVE DIAGNOSIS:  End-stage arthritis, medial compartment, left knee.  POSTOPERATIVE DIAGNOSES:  End-stage arthritis, medial compartment, left knee with some grade 3 change on the trochlea medially.  PROCEDURES:  Medial unicompartmental replacement, left knee for primary localized arthritis.  A cemented pegged #5 femoral component, cemented #5 tibial component with 8 mm polyethylene insert.  SURGEON:  Loreta Ave, M.D.  ASSISTANT:  Mikey Kirschner, PA was present throughout the entire case and was necessary for timely completion of the procedure.  ANESTHESIA:  General.  BLOOD LOSS:  Minimal.  SPECIMENS:  None.  CULTURES:  None.  COMPLICATIONS:  None.  DRESSINGS:  Soft compressive.  TOURNIQUET TIME:  1 hour 15 minutes.  DESCRIPTION OF PROCEDURE:  The patient was brought to operating room, placed on the operating table in supine position.  After adequate anesthesia had been obtained, tourniquet applied.  Prepped and draped in usual sterile fashion.  Exsanguinated with elevation of Esmarch. Tourniquet inflated to 350 mmHg.  Longitudinal incision, medial parapatellar.  Medial arthrotomy.  Grade IV changes, medial compartment. Medial capsule release.  Remnants of menisci were resected.  Some grade III changes, trochlea, patellofemoral joint.  The patella and lateral compartment otherwise looked good.  Utilizing appropriate instrumentation and guidance, the proximal tibial resection was made. This was size #5 component.  Utilizing the cut of the tibia, I then made the distal chamfer and posterior cuts on the femur.  Sized to #5 component.  Pleased  with stability, motion balancing with trials. Utilizing the jigs, the tibia and femur were both pre-drilled.  Copious irrigation with pulse irrigating device.  Cement prepared, placed on the tibial component, well seated.  Polyethylene attached to the tibia.  I then cemented the femoral component.  Once cement hardened, the knee was reexamined.  Nice correction of varus.  Full motion, good stability. Nicely balanced knee.  Wound irrigated.  Injected with Exparel. Arthrotomy closed with subcuticular closure.  Margins were injected with Marcaine.  Sterile compressive dressing applied.  Tourniquet deflated and removed.  Knee immobilizer applied.  Anesthesia reversed.  Brought to the recovery room.  Tolerated the surgery well.  No complications.     Loreta Ave, M.D.     DFM/MEDQ  D:  11/17/2014  T:  11/17/2014  Job:  [id number]

## 2018-02-11 ENCOUNTER — Other Ambulatory Visit: Payer: Self-pay | Admitting: Physician Assistant

## 2018-02-11 DIAGNOSIS — R197 Diarrhea, unspecified: Secondary | ICD-10-CM

## 2018-02-11 DIAGNOSIS — R1031 Right lower quadrant pain: Secondary | ICD-10-CM

## 2018-02-11 DIAGNOSIS — R1032 Left lower quadrant pain: Secondary | ICD-10-CM

## 2018-02-18 ENCOUNTER — Ambulatory Visit
Admission: RE | Admit: 2018-02-18 | Discharge: 2018-02-18 | Disposition: A | Discharge status: Home / Self Care | Payer: Managed Care, Other (non HMO) | Source: Ambulatory Visit | Attending: Physician Assistant | Admitting: Physician Assistant

## 2018-02-18 DIAGNOSIS — R197 Diarrhea, unspecified: Secondary | ICD-10-CM

## 2018-02-18 DIAGNOSIS — R1032 Left lower quadrant pain: Secondary | ICD-10-CM

## 2018-02-18 DIAGNOSIS — R1031 Right lower quadrant pain: Secondary | ICD-10-CM

## 2018-02-18 MED ORDER — IOPAMIDOL (ISOVUE-300) INJECTION 61%
100.0000 mL | Freq: Once | INTRAVENOUS | Status: AC | PRN
  Administered 2018-02-18: 100 mL via INTRAVENOUS

## 2018-02-18 MED ORDER — IOPAMIDOL (ISOVUE-300) INJECTION 61%: 100.0000 mL | Freq: Once | INTRAVENOUS | Status: DC | PRN

## 2018-05-15 ENCOUNTER — Other Ambulatory Visit (HOSPITAL_COMMUNITY): Payer: Self-pay | Admitting: Gastroenterology

## 2018-05-15 DIAGNOSIS — R112 Nausea with vomiting, unspecified: Secondary | ICD-10-CM

## 2018-05-29 ENCOUNTER — Ambulatory Visit (HOSPITAL_COMMUNITY)
Admission: RE | Admit: 2018-05-29 | Discharge: 2018-05-29 | Disposition: A | Discharge status: Home / Self Care | Payer: Managed Care, Other (non HMO) | Source: Ambulatory Visit | Attending: Gastroenterology | Admitting: Gastroenterology

## 2018-05-29 DIAGNOSIS — R112 Nausea with vomiting, unspecified: Secondary | ICD-10-CM | POA: Diagnosis not present

## 2018-05-29 MED ORDER — TECHNETIUM TC 99M MEBROFENIN IV KIT
5.1000 | PACK | Freq: Once | INTRAVENOUS | Status: AC | PRN
  Administered 2018-05-29: 5.1 via INTRAVENOUS

## 2018-06-02 ENCOUNTER — Other Ambulatory Visit (HOSPITAL_COMMUNITY): Payer: Self-pay | Admitting: Gastroenterology

## 2018-06-02 DIAGNOSIS — R112 Nausea with vomiting, unspecified: Secondary | ICD-10-CM

## 2018-06-10 ENCOUNTER — Encounter (HOSPITAL_COMMUNITY)
Admission: RE | Admit: 2018-06-10 | Discharge: 2018-06-10 | Disposition: A | Discharge status: Home / Self Care | Payer: Managed Care, Other (non HMO) | Source: Ambulatory Visit | Attending: Gastroenterology | Admitting: Gastroenterology

## 2018-06-10 DIAGNOSIS — R112 Nausea with vomiting, unspecified: Secondary | ICD-10-CM | POA: Insufficient documentation

## 2018-06-10 MED ORDER — TECHNETIUM TC 99M SULFUR COLLOID
2.0000 | Freq: Once | INTRAVENOUS | Status: AC | PRN
  Administered 2018-06-10: 2 via ORAL

## 2018-09-18 ENCOUNTER — Encounter (HOSPITAL_BASED_OUTPATIENT_CLINIC_OR_DEPARTMENT_OTHER): Payer: Self-pay | Admitting: *Deleted

## 2018-09-18 ENCOUNTER — Other Ambulatory Visit: Payer: Self-pay

## 2018-09-22 ENCOUNTER — Other Ambulatory Visit: Payer: Self-pay

## 2018-09-22 ENCOUNTER — Other Ambulatory Visit (HOSPITAL_COMMUNITY)
Admission: RE | Admit: 2018-09-22 | Discharge: 2018-09-22 | Disposition: A | Discharge status: Home / Self Care | Payer: Managed Care, Other (non HMO) | Source: Ambulatory Visit | Attending: Orthopedic Surgery | Admitting: Orthopedic Surgery

## 2018-09-22 ENCOUNTER — Encounter (HOSPITAL_BASED_OUTPATIENT_CLINIC_OR_DEPARTMENT_OTHER)
Admission: RE | Admit: 2018-09-22 | Discharge: 2018-09-22 | Disposition: A | Discharge status: Home / Self Care | Payer: Managed Care, Other (non HMO) | Source: Ambulatory Visit | Attending: Orthopedic Surgery | Admitting: Orthopedic Surgery

## 2018-09-22 DIAGNOSIS — K219 Gastro-esophageal reflux disease without esophagitis: Secondary | ICD-10-CM | POA: Diagnosis not present

## 2018-09-22 DIAGNOSIS — Z01818 Encounter for other preprocedural examination: Secondary | ICD-10-CM | POA: Diagnosis present

## 2018-09-22 DIAGNOSIS — E119 Type 2 diabetes mellitus without complications: Secondary | ICD-10-CM | POA: Diagnosis not present

## 2018-09-22 DIAGNOSIS — M19012 Primary osteoarthritis, left shoulder: Secondary | ICD-10-CM | POA: Diagnosis present

## 2018-09-22 DIAGNOSIS — Z1159 Encounter for screening for other viral diseases: Secondary | ICD-10-CM | POA: Insufficient documentation

## 2018-09-22 DIAGNOSIS — R9431 Abnormal electrocardiogram [ECG] [EKG]: Secondary | ICD-10-CM | POA: Diagnosis not present

## 2018-09-22 DIAGNOSIS — M1711 Unilateral primary osteoarthritis, right knee: Secondary | ICD-10-CM | POA: Diagnosis not present

## 2018-09-22 DIAGNOSIS — G5603 Carpal tunnel syndrome, bilateral upper limbs: Secondary | ICD-10-CM | POA: Diagnosis not present

## 2018-09-22 DIAGNOSIS — J449 Chronic obstructive pulmonary disease, unspecified: Secondary | ICD-10-CM | POA: Diagnosis not present

## 2018-09-22 LAB — BASIC METABOLIC PANEL
Anion gap: 11 (ref 5–15)
BUN: 17 mg/dL (ref 6–20)
CO2: 22 mmol/L (ref 22–32)
Calcium: 9.3 mg/dL (ref 8.9–10.3)
Chloride: 102 mmol/L (ref 98–111)
Creatinine, Ser: 0.93 mg/dL (ref 0.61–1.24)
GFR calc Af Amer: >60 mL/min (ref >60)
GFR calc non Af Amer: >60 mL/min (ref >60)
Glucose, Bld: 309 mg/dL — ABNORMAL HIGH (ref 70–99)
Potassium: 4.1 mmol/L (ref 3.5–5.1)
Sodium: 135 mmol/L (ref 135–145)

## 2018-09-22 NOTE — Progress Notes (Addendum)
Dr. Royce Macadamia reviewed EKG - ok for surgery. Pt given G2 and instructed to drink by 0830 day of surgery with teach back method.

## 2018-09-22 NOTE — H&P (Signed)
[facility] [address] [place], [place] [address] [phone] A Division of [facility]                                                                   RE: NOAL, ABSHIER   [id number]   [birth year]  09-16-18 Reason for visit: New evaluation of bilateral carpal tunnel syndrome.  Status post left unicompartmental knee arthroplasty by Dr. Amada Jupiter in 2016.  Chronic right medial knee pain.  Left shoulder known AC arthropathy with planned open distal clavicle excision (scheduled).   History of present illness: His left shoulder open distal clavicle excision was delayed due to COVID.  We are rescheduling this today.  He is status post left unicompartmental knee arthroplasty by Dr. Amada Jupiter and does okay with this.  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  He denies any fevers or systemic symptoms or trouble with his motion.  He has chronic right medial knee pain and is hoping to discuss non-operative options for this.  Seen previously by his primary care physician and a different orthopedist and diagnosed with bilateral carpal tunnel syndrome.  It has been going on for six months or more.  He has intermittent numbness.  Hoping for bilateral Cortisone injections today.     EXAMINATION: Well appearing and in no apparent distress.  Ambulates without aid or apparent discomfort.  Full motion of bilateral knees.  Left knee incisions fully healed.  No signs of infection.  Minimal soreness medial side.  Stable to varus and valgus stress.  Right knee medial joint line pain.  No effusion.  Neurovascularly intact. Left shoulder AC pain with cross body adduction and palpation at the [facility] joint.  Neurovascularly intact.  Full motion.  Bilateral hands without atrophy.  Good strength.  Positive Tinel's bilaterally at the carpal tunnel.    X-RAYS: Bilateral knee  x-rays today show stable left unicompartmental knee arthroplasty without interval abnormality or obvious signs of loosening.  Moderate medial joint space narrowing on the right with some mild patellofemoral changes.   Bilateral wrist carpal tunnel x-rays showed no obvious acute osseous abnormality.  Some mild chronic changes.    ASSESSMENT/PLAN: Left shoulder AC arthropathy-reschedule open distal clavicle excision as previously discussed at length, as well as pre and postoperative course.  Risks and benefits.   Bilateral carpal tunnel syndrome.  Plan for Cortisone injections bilaterally today.  We will follow his symptoms.  Briefly discussed surgical options, as well as activity modification given due to the nature of his work. Status post left medial compartment knee arthroplasty-this is stable.   Right medial knee OA.  He has had Cortisone injections in the past, but they were not long lasting.  We discussed Visco supplementation injections, he was hoping to setup approval for this.   Ernesta Amble.  Percell Miller, M.D.  Dictated by: Lemar Lofty, PA-C Electronically verified by Ernesta Amble Percell Miller, M.D. TDM(HCM):jjh D 09-17-18 T 09-21-18

## 2018-09-22 NOTE — Progress Notes (Addendum)
Notified Dr. Royce Macadamia of pt's Glucose 309 and he told me he did not take his Wilder Glade this morning. Dr. Royce Macadamia would like for him to take all diabetes meds., check blood sugar daily, watch his diet and drink water until surgery day. Called Pt and told him to take [place] daily, go ahead and take Trulicity today, take all diabetes meds. daily and check blood sugar daily.Explained if blood sugar still high day of surgery, they may not do it. Pt said he would do the above. We will recheck blood sugar day of surgery.

## 2018-09-23 LAB — NOVEL CORONAVIRUS, NAA (HOSP ORDER, SEND-OUT TO REF LAB; TAT 18-24 HRS): SARS-CoV-2, NAA: NOT DETECTED

## 2018-09-25 ENCOUNTER — Ambulatory Visit (HOSPITAL_BASED_OUTPATIENT_CLINIC_OR_DEPARTMENT_OTHER)
Admission: RE | Admit: 2018-09-25 | Discharge: 2018-09-25 | Disposition: A | Discharge status: Home / Self Care | Payer: Managed Care, Other (non HMO) | Attending: Orthopedic Surgery | Admitting: Orthopedic Surgery

## 2018-09-25 ENCOUNTER — Ambulatory Visit (HOSPITAL_BASED_OUTPATIENT_CLINIC_OR_DEPARTMENT_OTHER): Payer: Managed Care, Other (non HMO) | Admitting: Anesthesiology

## 2018-09-25 ENCOUNTER — Encounter (HOSPITAL_BASED_OUTPATIENT_CLINIC_OR_DEPARTMENT_OTHER)
Admission: RE | Disposition: A | Discharge status: Home / Self Care | Payer: Self-pay | Source: Home / Self Care | Attending: Orthopedic Surgery

## 2018-09-25 ENCOUNTER — Other Ambulatory Visit: Payer: Self-pay

## 2018-09-25 ENCOUNTER — Encounter (HOSPITAL_BASED_OUTPATIENT_CLINIC_OR_DEPARTMENT_OTHER): Payer: Self-pay | Admitting: Emergency Medicine

## 2018-09-25 DIAGNOSIS — G5603 Carpal tunnel syndrome, bilateral upper limbs: Secondary | ICD-10-CM | POA: Insufficient documentation

## 2018-09-25 DIAGNOSIS — M19012 Primary osteoarthritis, left shoulder: Secondary | ICD-10-CM

## 2018-09-25 DIAGNOSIS — K219 Gastro-esophageal reflux disease without esophagitis: Secondary | ICD-10-CM | POA: Insufficient documentation

## 2018-09-25 DIAGNOSIS — E119 Type 2 diabetes mellitus without complications: Secondary | ICD-10-CM | POA: Insufficient documentation

## 2018-09-25 DIAGNOSIS — M1711 Unilateral primary osteoarthritis, right knee: Secondary | ICD-10-CM | POA: Insufficient documentation

## 2018-09-25 DIAGNOSIS — J449 Chronic obstructive pulmonary disease, unspecified: Secondary | ICD-10-CM | POA: Insufficient documentation

## 2018-09-25 HISTORY — DX: Gastro-esophageal reflux disease without esophagitis: K21.9

## 2018-09-25 HISTORY — DX: Other complications of anesthesia, initial encounter: T88.59XA

## 2018-09-25 HISTORY — PX: RESECTION DISTAL CLAVICAL: SHX5053

## 2018-09-25 HISTORY — DX: Pain in left shoulder: M25.512

## 2018-09-25 HISTORY — DX: Unspecified asthma, uncomplicated: J45.909

## 2018-09-25 LAB — GLUCOSE, CAPILLARY
Glucose-Capillary: 133 mg/dL — ABNORMAL HIGH (ref 70–99)
Glucose-Capillary: 132 mg/dL — ABNORMAL HIGH (ref 70–99)

## 2018-09-25 SURGERY — EXCISION, CLAVICLE, DISTAL, OPEN
Anesthesia: General | Site: Shoulder | Laterality: Left

## 2018-09-25 MED ORDER — MIDAZOLAM HCL 2 MG/2ML IJ SOLN: 0.5000 mg | Freq: Once | INTRAMUSCULAR | Status: DC | PRN

## 2018-09-25 MED ORDER — SUCCINYLCHOLINE CHLORIDE 20 MG/ML IJ SOLN
INTRAMUSCULAR | Status: DC | PRN
  Administered 2018-09-25: 100 mg via INTRAVENOUS

## 2018-09-25 MED ORDER — HYDROMORPHONE HCL 1 MG/ML IJ SOLN
0.2500 mg | INTRAMUSCULAR | Status: DC | PRN
  Administered 2018-09-25 (×2): 0.25 mg via INTRAVENOUS

## 2018-09-25 MED ORDER — CELECOXIB 200 MG PO CAPS
ORAL_CAPSULE | ORAL | Status: AC
  Filled 2018-09-25: qty 2

## 2018-09-25 MED ORDER — BUPIVACAINE HCL (PF) 0.25 % IJ SOLN
INTRAMUSCULAR | Status: AC
  Filled 2018-09-25: qty 30

## 2018-09-25 MED ORDER — ONDANSETRON HCL 4 MG PO TABS: 4.0000 mg | ORAL_TABLET | Freq: Three times a day (TID) | ORAL | 0 refills | Status: DC | PRN

## 2018-09-25 MED ORDER — CEFAZOLIN SODIUM-DEXTROSE 2-4 GM/100ML-% IV SOLN
2.0000 g | INTRAVENOUS | Status: AC
  Administered 2018-09-25: 2 g via INTRAVENOUS

## 2018-09-25 MED ORDER — MIDAZOLAM HCL 2 MG/2ML IJ SOLN
1.0000 mg | INTRAMUSCULAR | Status: DC | PRN
  Administered 2018-09-25: 2 mg via INTRAVENOUS

## 2018-09-25 MED ORDER — HYDROCODONE-ACETAMINOPHEN 5-325 MG PO TABS: 1.0000 | ORAL_TABLET | Freq: Four times a day (QID) | ORAL | 0 refills | Status: AC | PRN

## 2018-09-25 MED ORDER — DEXAMETHASONE SODIUM PHOSPHATE 4 MG/ML IJ SOLN
INTRAMUSCULAR | Status: DC | PRN
  Administered 2018-09-25: 10 mg via INTRAVENOUS

## 2018-09-25 MED ORDER — FENTANYL CITRATE (PF) 100 MCG/2ML IJ SOLN
50.0000 ug | INTRAMUSCULAR | Status: DC | PRN
  Administered 2018-09-25: 100 ug via INTRAVENOUS

## 2018-09-25 MED ORDER — CEFAZOLIN SODIUM-DEXTROSE 2-4 GM/100ML-% IV SOLN
INTRAVENOUS | Status: AC
  Filled 2018-09-25: qty 100

## 2018-09-25 MED ORDER — BUPIVACAINE HCL (PF) 0.5 % IJ SOLN
INTRAMUSCULAR | Status: DC | PRN
  Administered 2018-09-25: 15 mL via PERINEURAL

## 2018-09-25 MED ORDER — MEPERIDINE HCL 25 MG/ML IJ SOLN: 6.2500 mg | INTRAMUSCULAR | Status: DC | PRN

## 2018-09-25 MED ORDER — LIDOCAINE 2% (20 MG/ML) 5 ML SYRINGE
INTRAMUSCULAR | Status: DC | PRN
  Administered 2018-09-25: 60 mg via INTRAVENOUS

## 2018-09-25 MED ORDER — CHLORHEXIDINE GLUCONATE 4 % EX LIQD: 60.0000 mL | Freq: Once | CUTANEOUS | Status: DC

## 2018-09-25 MED ORDER — GABAPENTIN 300 MG PO CAPS: 300.0000 mg | ORAL_CAPSULE | Freq: Once | ORAL | Status: DC

## 2018-09-25 MED ORDER — BUPIVACAINE LIPOSOME 1.3 % IJ SUSP
INTRAMUSCULAR | Status: DC | PRN
  Administered 2018-09-25: 10 mL via PERINEURAL

## 2018-09-25 MED ORDER — MIDAZOLAM HCL 2 MG/2ML IJ SOLN
INTRAMUSCULAR | Status: AC
  Filled 2018-09-25: qty 2

## 2018-09-25 MED ORDER — ONDANSETRON HCL 4 MG/2ML IJ SOLN
INTRAMUSCULAR | Status: DC | PRN
  Administered 2018-09-25: 4 mg via INTRAVENOUS

## 2018-09-25 MED ORDER — GABAPENTIN 300 MG PO CAPS
ORAL_CAPSULE | ORAL | Status: AC
  Filled 2018-09-25: qty 1

## 2018-09-25 MED ORDER — PROPOFOL 10 MG/ML IV BOLUS
INTRAVENOUS | Status: DC | PRN
  Administered 2018-09-25: 150 mg via INTRAVENOUS

## 2018-09-25 MED ORDER — HYDROMORPHONE HCL 1 MG/ML IJ SOLN
INTRAMUSCULAR | Status: AC
  Filled 2018-09-25: qty 0.5

## 2018-09-25 MED ORDER — SCOPOLAMINE 1 MG/3DAYS TD PT72: 1.0000 | MEDICATED_PATCH | Freq: Once | TRANSDERMAL | Status: DC | PRN

## 2018-09-25 MED ORDER — PROMETHAZINE HCL 25 MG/ML IJ SOLN
INTRAMUSCULAR | Status: AC
  Filled 2018-09-25: qty 1

## 2018-09-25 MED ORDER — HYDROCODONE-ACETAMINOPHEN 5-325 MG PO TABS: 1.0000 | ORAL_TABLET | Freq: Four times a day (QID) | ORAL | 0 refills | Status: DC | PRN

## 2018-09-25 MED ORDER — ACETAMINOPHEN 500 MG PO TABS
ORAL_TABLET | ORAL | Status: AC
  Filled 2018-09-25: qty 2

## 2018-09-25 MED ORDER — PROMETHAZINE HCL 25 MG/ML IJ SOLN
6.2500 mg | INTRAMUSCULAR | Status: DC | PRN
  Administered 2018-09-25: 6.25 mg via INTRAVENOUS

## 2018-09-25 MED ORDER — SODIUM CHLORIDE 0.9 % IR SOLN
Status: DC | PRN
  Administered 2018-09-25: 1000 mL

## 2018-09-25 MED ORDER — METHYLPREDNISOLONE ACETATE 40 MG/ML IJ SUSP
INTRAMUSCULAR | Status: AC
  Filled 2018-09-25: qty 1

## 2018-09-25 MED ORDER — LACTATED RINGERS IV SOLN
INTRAVENOUS | Status: DC
  Administered 2018-09-25: 11:00:00 via INTRAVENOUS

## 2018-09-25 MED ORDER — ACETAMINOPHEN 500 MG PO TABS
1000.0000 mg | ORAL_TABLET | Freq: Once | ORAL | Status: AC
  Administered 2018-09-25: 1000 mg via ORAL

## 2018-09-25 MED ORDER — CELECOXIB 400 MG PO CAPS: 400.0000 mg | ORAL_CAPSULE | Freq: Once | ORAL | Status: DC

## 2018-09-25 MED ORDER — LACTATED RINGERS IV SOLN
INTRAVENOUS | Status: DC
  Administered 2018-09-25 (×2): via INTRAVENOUS

## 2018-09-25 MED ORDER — FENTANYL CITRATE (PF) 100 MCG/2ML IJ SOLN
INTRAMUSCULAR | Status: AC
  Filled 2018-09-25: qty 2

## 2018-09-25 SURGICAL SUPPLY — 57 items
BENZOIN TINCTURE PRP APPL 2/3 (GAUZE/BANDAGES/DRESSINGS) ×4 IMPLANT
BLADE AVERAGE 25MMX9MM (BLADE) ×1
BLADE AVERAGE 25X9 (BLADE) ×3 IMPLANT
BUR OVAL 4.0 (BURR) IMPLANT
BURR OVAL 8 FLU 5.0MM X 13CM (MISCELLANEOUS)
BURR OVAL 8 FLU 5.0X13 (MISCELLANEOUS) IMPLANT
CHLORAPREP W/TINT 26 (MISCELLANEOUS) ×4 IMPLANT
CLOSURE STERI-STRIP 1/2X4 (GAUZE/BANDAGES/DRESSINGS) ×1
CLSR STERI-STRIP ANTIMIC 1/2X4 (GAUZE/BANDAGES/DRESSINGS) ×3 IMPLANT
COVER WAND RF STERILE (DRAPES) IMPLANT
DECANTER SPIKE VIAL GLASS SM (MISCELLANEOUS) IMPLANT
DRAPE IMP U-DRAPE 54X76 (DRAPES) ×4 IMPLANT
DRAPE INCISE IOBAN 66X45 STRL (DRAPES) ×4 IMPLANT
DRAPE SHOULDER BEACH CHAIR (DRAPES) ×4 IMPLANT
DRAPE U-SHAPE 47X51 STRL (DRAPES) ×4 IMPLANT
DRSG MEPILEX BORDER 4X4 (GAUZE/BANDAGES/DRESSINGS) ×4 IMPLANT
DRSG PAD ABDOMINAL 8X10 ST (GAUZE/BANDAGES/DRESSINGS) ×4 IMPLANT
ELECT NEEDLE TIP 2.8 STRL (NEEDLE) IMPLANT
ELECT REM PT RETURN 9FT ADLT (ELECTROSURGICAL) ×4
ELECTRODE REM PT RTRN 9FT ADLT (ELECTROSURGICAL) ×2 IMPLANT
GAUZE SPONGE 4X4 12PLY STRL (GAUZE/BANDAGES/DRESSINGS) ×4 IMPLANT
GAUZE XEROFORM 1X8 LF (GAUZE/BANDAGES/DRESSINGS) IMPLANT
GLOVE BIO SURGEON STRL SZ7.5 (GLOVE) ×8 IMPLANT
GLOVE BIOGEL PI IND STRL 8 (GLOVE) ×4 IMPLANT
GLOVE BIOGEL PI IND STRL 8.5 (GLOVE) IMPLANT
GLOVE BIOGEL PI INDICATOR 8 (GLOVE) ×4
GLOVE BIOGEL PI INDICATOR 8.5 (GLOVE)
GOWN STRL REUS W/ TWL LRG LVL3 (GOWN DISPOSABLE) ×4 IMPLANT
GOWN STRL REUS W/ TWL XL LVL3 (GOWN DISPOSABLE) ×2 IMPLANT
GOWN STRL REUS W/TWL LRG LVL3 (GOWN DISPOSABLE) ×4
GOWN STRL REUS W/TWL XL LVL3 (GOWN DISPOSABLE) ×2
MANIFOLD NEPTUNE II (INSTRUMENTS) ×4 IMPLANT
NDL SUT 6 .5 CRC .975X.05 MAYO (NEEDLE) IMPLANT
NEEDLE MAYO TAPER (NEEDLE)
PACK BASIN DAY SURGERY FS (CUSTOM PROCEDURE TRAY) ×4 IMPLANT
PENCIL BUTTON HOLSTER BLD 10FT (ELECTRODE) ×4 IMPLANT
RESTRAINT HEAD UNIVERSAL NS (MISCELLANEOUS) ×4 IMPLANT
SLEEVE SCD COMPRESS KNEE MED (MISCELLANEOUS) ×4 IMPLANT
SLING ARM FOAM STRAP LRG (SOFTGOODS) ×4 IMPLANT
SLING ARM IMMOBILIZER LRG (SOFTGOODS) IMPLANT
SLING ARM IMMOBILIZER MED (SOFTGOODS) IMPLANT
SLING ARM MED ADULT FOAM STRAP (SOFTGOODS) IMPLANT
SLING ARM XL FOAM STRAP (SOFTGOODS) IMPLANT
SUCTION FRAZIER HANDLE 10FR (MISCELLANEOUS)
SUCTION TUBE FRAZIER 10FR DISP (MISCELLANEOUS) IMPLANT
SUT ETHIBOND 2 OS 4 DA (SUTURE) IMPLANT
SUT ETHILON 2 0 FS 18 (SUTURE) IMPLANT
SUT ETHILON 3 0 PS 1 (SUTURE) IMPLANT
SUT MNCRL AB 3-0 PS2 18 (SUTURE) ×4 IMPLANT
SUT MNCRL AB 4-0 PS2 18 (SUTURE) ×4 IMPLANT
SUT VIC AB 0 CT1 27 (SUTURE) ×2
SUT VIC AB 0 CT1 27XBRD ANBCTR (SUTURE) ×2 IMPLANT
SUT VIC AB 2-0 SH 27 (SUTURE) ×2
SUT VIC AB 2-0 SH 27XBRD (SUTURE) ×2 IMPLANT
SUT VIC AB 3-0 FS2 27 (SUTURE) IMPLANT
TOWEL GREEN STERILE FF (TOWEL DISPOSABLE) ×4 IMPLANT
YANKAUER SUCT BULB TIP NO VENT (SUCTIONS) IMPLANT

## 2018-09-25 NOTE — Anesthesia Preprocedure Evaluation (Addendum)
Anesthesia Evaluation  Patient identified by MRN, date of birth, ID band Patient awake    Reviewed: Allergy & Precautions, NPO status , Patient's Chart, lab work & pertinent test results  History of Anesthesia Complications Negative for: history of anesthetic complications  Airway Mallampati: II  TM Distance: >3 FB Neck ROM: Full    Dental  (+) Dental Advisory Given   Pulmonary COPD,  COPD inhaler, former smoker,  09/22/2018 SARS coronavirus NEG   breath sounds clear to auscultation       Cardiovascular negative cardio ROS   Rhythm:Regular Rate:Normal     Neuro/Psych negative neurological ROS     GI/Hepatic Neg liver ROS, GERD  Poorly Controlled,  Endo/Other  diabetes (glu 133), Oral Hypoglycemic Agents  Renal/GU negative Renal ROS     Musculoskeletal  (+) Arthritis ,   Abdominal   Peds  Hematology negative hematology ROS (+)   Anesthesia Other Findings   Reproductive/Obstetrics                            Anesthesia Physical Anesthesia Plan  ASA: II  Anesthesia Plan: General   Post-op Pain Management: GA combined w/ Regional for post-op pain   Induction: Intravenous  PONV Risk Score and Plan: 2 and Ondansetron and Dexamethasone  Airway Management Planned: Oral ETT  Additional Equipment:   Intra-op Plan:   Post-operative Plan: Extubation in OR  Informed Consent: I have reviewed the patients History and Physical, chart, labs and discussed the procedure including the risks, benefits and alternatives for the proposed anesthesia with the patient or authorized representative who has indicated his/her understanding and acceptance.     Dental advisory given  Plan Discussed with: CRNA and Surgeon  Anesthesia Plan Comments: (Plan routine monitors, GETA with Exparel interscalene block for post op analgesia)        Anesthesia Quick Evaluation

## 2018-09-25 NOTE — Anesthesia Procedure Notes (Signed)
Procedure Name: Intubation Date/Time: 09/25/2018 12:55 PM Performed by: Eulas Post, Hazley Dezeeuw W, CRNA Pre-anesthesia Checklist: Patient identified, Emergency Drugs available, Suction available and Patient being monitored Patient Re-evaluated:Patient Re-evaluated prior to induction Oxygen Delivery Method: Circle system utilized Preoxygenation: Pre-oxygenation with 100% oxygen Induction Type: IV induction Ventilation: Mask ventilation without difficulty Laryngoscope Size: Miller and 2 Grade View: Grade I Tube type: Oral Tube size: 8.0 mm Number of attempts: 1 Airway Equipment and Method: Stylet and Oral airway Placement Confirmation: ETT inserted through vocal cords under direct vision,  positive ETCO2 and breath sounds checked- equal and bilateral Secured at: 23 cm Tube secured with: Tape Dental Injury: Teeth and Oropharynx as per pre-operative assessment

## 2018-09-25 NOTE — Discharge Instructions (Signed)
Diet: As you were doing prior to hospitalization   Dressing:  Keep dressings on and dry until follow up.  Activity:  Increase activity slowly as tolerated, but follow the weight bearing instructions below.  The rules on driving is that you can not be taking narcotics while you drive, and you must feel in control of the vehicle.    Weight Bearing:  Limit strenuous use of left arm until follow up.  To prevent constipation: you may use a stool softener such as -  Colace (over the counter) 100 mg by mouth twice a day  Drink plenty of fluids (prune juice may be helpful) and high fiber foods Miralax (over the counter) for constipation as needed.    Itching:  If you experience itching with your medications, try taking only a single pain pill, or even half a pain pill at a time.  You can also use benadryl over the counter for itching or also to help with sleep.   Precautions:  If you experience chest pain or shortness of breath - call 911 immediately for transfer to the hospital emergency department!!  If you develop a fever greater that 101 F, purulent drainage from wound, increased redness or drainage from wound, or calf pain -- Call the office at [phone]                                                 Follow- Up Appointment:  Please call for an appointment to be seen in 1-2 weeks [place] - [phone]  **You had 1000 mg of Tylenol at 11:05am   Post Anesthesia Home Care Instructions  Activity: Get plenty of rest for the remainder of the day. A responsible individual must stay with you for 24 hours following the procedure.  For the next 24 hours, DO NOT: -Drive a car -Paediatric nurse -Drink alcoholic beverages -Take any medication unless instructed by your physician -Make any legal decisions or sign important papers.  Meals: Start with liquid foods such as gelatin or soup. Progress to regular foods as tolerated. Avoid greasy, spicy, heavy foods. If nausea and/or vomiting  occur, drink only clear liquids until the nausea and/or vomiting subsides. Call your physician if vomiting continues.  Special Instructions/Symptoms: Your throat may feel dry or sore from the anesthesia or the breathing tube placed in your throat during surgery. If this causes discomfort, gargle with warm salt water. The discomfort should disappear within 24 hours.  If you had a scopolamine patch placed behind your ear for the management of post- operative nausea and/or vomiting:  1. The medication in the patch is effective for 72 hours, after which it should be removed.  Wrap patch in a tissue and discard in the trash. Wash hands thoroughly with soap and water. 2. You may remove the patch earlier than 72 hours if you experience unpleasant side effects which may include dry mouth, dizziness or visual disturbances. 3. Avoid touching the patch. Wash your hands with soap and water after contact with the patch.    Regional Anesthesia Blocks  1. Numbness or the inability to move the "blocked" extremity may last from 3-48 hours after placement. The length of time depends on the medication injected and your individual response to the medication. If the numbness is not going away after 48 hours, call your surgeon.  2. The extremity that is  blocked will need to be protected until the numbness is gone and the  Strength has returned. Because you cannot feel it, you will need to take extra care to avoid injury. Because it may be weak, you may have difficulty moving it or using it. You may not know what position it is in without looking at it while the block is in effect.  3. For blocks in the legs and feet, returning to weight bearing and walking needs to be done carefully. You will need to wait until the numbness is entirely gone and the strength has returned. You should be able to move your leg and foot normally before you try and bear weight or walk. You will need someone to be with you when you first try to  ensure you do not fall and possibly risk injury.  4. Bruising and tenderness at the needle site are common side effects and will resolve in a few days.  5. Persistent numbness or new problems with movement should be communicated to the surgeon or the [facility]  [phone])/ [facility] [phone]).  Information for Discharge Teaching: EXPAREL (bupivacaine liposome injectable suspension)   Your surgeon or anesthesiologist gave you EXPAREL(bupivacaine) to help control your pain after surgery.   EXPAREL is a local anesthetic that provides pain relief by numbing the tissue around the surgical site.  EXPAREL is designed to release pain medication over time and can control pain for up to 72 hours.  Depending on how you respond to EXPAREL, you may require less pain medication during your recovery.  Possible side effects:  Temporary loss of sensation or ability to move in the area where bupivacaine was injected.  Nausea, vomiting, constipation  Rarely, numbness and tingling in your mouth or lips, lightheadedness, or anxiety may occur.  Call your doctor right away if you think you may be experiencing any of these sensations, or if you have other questions regarding possible side effects.  Follow all other discharge instructions given to you by your surgeon or nurse. Eat a healthy diet and drink plenty of water or other fluids.  If you return to the hospital for any reason within 96 hours following the administration of EXPAREL, it is important for health care providers to know that you have received this anesthetic. A teal colored band has been placed on your arm with the date, time and amount of EXPAREL you have received in order to alert and inform your health care providers. Please leave this armband in place for the full 96 hours following administration, and then you may remove the band.

## 2018-09-25 NOTE — Progress Notes (Signed)
Exparel was given during block procedure and band placed on patient.

## 2018-09-25 NOTE — Progress Notes (Signed)
Assisted Dr. Carswell Jackson with left, ultrasound guided, interscalene  block. Side rails up, monitors on throughout procedure. See vital signs in flow sheet. Tolerated Procedure well. °

## 2018-09-25 NOTE — Transfer of Care (Signed)
Immediate Anesthesia Transfer of Care Note  Patient: Wesley Castillo  Procedure(s) Performed: OPEN DISTAL CLAVICLE EXCISION (Left Shoulder)  Patient Location: PACU  Anesthesia Type:General  Level of Consciousness: drowsy  Airway & Oxygen Therapy: Patient Spontanous Breathing and Patient connected to nasal cannula oxygen  Post-op Assessment: Report given to RN and Post -op Vital signs reviewed and stable  Post vital signs: Reviewed and stable  Last Vitals:  Vitals Value Taken Time  BP 115/93 09/25/18 1400  Temp    Pulse 71 09/25/18 1402  Resp 13 09/25/18 1402  SpO2 96 % 09/25/18 1402  Vitals shown include unvalidated device data.  Last Pain:  Vitals:   09/25/18 1230  TempSrc:   PainSc: 0-No pain         Complications: No apparent anesthesia complications

## 2018-09-25 NOTE — Anesthesia Procedure Notes (Signed)
Anesthesia Regional Block: Interscalene brachial plexus block   Pre-Anesthetic Checklist: ,, timeout performed, Correct Patient, Correct Site, Correct Laterality, Correct Procedure, Correct Position, site marked, Risks and benefits discussed,  Surgical consent,  Pre-op evaluation,  At surgeon's request and post-op pain management  Laterality: Left and Upper  Prep: chloraprep       Needles:  Injection technique: Single-shot  Needle Type: Echogenic Stimulator Needle     Needle Length: 9cm  Needle Gauge: 21     Additional Needles:   Procedures:, nerve stimulator,,, ultrasound used (permanent image in chart),,,,   Nerve Stimulator or Paresthesia:  Response: forearm twitch, 0.4 mA, 0.1 ms,   Additional Responses:   Narrative:  Start time: 09/25/2018 12:18 PM End time: 09/25/2018 12:24 PM Injection made incrementally with aspirations every 5 mL.  Performed by: Personally  Anesthesiologist: Annye Asa, MD  Additional Notes: Pt identified in Holding room.  Monitors applied. Working IV access confirmed. Sterile prep, drape L clavicle and neck.  #21ga Echogenic PNS to forearm twitch at 0.70mA threshold.  15cc 0.5% Bupivacaine with Exparel injected incrementally after negative test dose.  Patient asymptomatic, VSS, no heme aspirated, tolerated well.  Jenita Seashore, MD

## 2018-09-25 NOTE — Interval H&P Note (Signed)
I participated in the care of this patient and agree with the above history, physical and evaluation. I performed a review of the history and a physical exam as detailed   Timothy Daniel Murphy MD  

## 2018-09-25 NOTE — Anesthesia Postprocedure Evaluation (Signed)
Anesthesia Post Note  Patient: Anthem Frazer  Procedure(s) Performed: OPEN DISTAL CLAVICLE EXCISION (Left Shoulder)     Patient location during evaluation: Phase II Anesthesia Type: General Level of consciousness: awake and alert, patient cooperative and oriented Pain management: pain level controlled Vital Signs Assessment: post-procedure vital signs reviewed and stable Respiratory status: spontaneous breathing, respiratory function stable and nonlabored ventilation Cardiovascular status: blood pressure returned to baseline and stable Postop Assessment: no apparent nausea or vomiting, adequate PO intake and able to ambulate Anesthetic complications: no    Last Vitals:  Vitals:   09/25/18 1530 09/25/18 1556  BP:    Pulse:  62  Resp:    Temp:    SpO2: 97% 95%    Last Pain:  Vitals:   09/25/18 1556  TempSrc:   PainSc: 0-No pain                 Rawad Bochicchio,E. Ollie Esty

## 2018-09-25 NOTE — Interval H&P Note (Signed)
I participated in the care of this patient and agree with the above history, physical and evaluation. I performed a review of the history and a physical exam as detailed   Martena Emanuele Daniel Macaela Presas MD  

## 2018-09-28 ENCOUNTER — Encounter (HOSPITAL_BASED_OUTPATIENT_CLINIC_OR_DEPARTMENT_OTHER): Payer: Self-pay | Admitting: Orthopedic Surgery

## 2018-09-29 NOTE — Op Note (Signed)
09/25/2018  8:54 AM  PATIENT:  Wesley Castillo    PRE-OPERATIVE DIAGNOSIS:   ARTICULAR CARTILAGE DISORDERS, OSTEOARTHRITIS  POST-OPERATIVE DIAGNOSIS:  Same  PROCEDURE:  OPEN DISTAL CLAVICLE EXCISION  SURGEON:  Renette Butters, MD  ASSISTANT: Roxan Hockey, PA-C, he was present and scrubbed throughout the case, critical for completion in a timely fashion, and for retraction, instrumentation, and closure.   ANESTHESIA:   gen  PREOPERATIVE INDICATIONS:  Wesley Castillo is a  53 y.o. male with a diagnosis of  [place] who failed conservative measures and elected for surgical management.    The risks benefits and alternatives were discussed with the patient preoperatively including but not limited to the risks of infection, bleeding, nerve injury, cardiopulmonary complications, the need for revision surgery, among others, and the patient was willing to proceed.  OPERATIVE IMPLANTS: none  OPERATIVE FINDINGS: ac joint OA  BLOOD LOSS: min  COMPLICATIONS: none  TOURNIQUET TIME: none  OPERATIVE PROCEDURE:  Patient was identified in the preoperative holding area and site was marked by me He was transported to the operating theater and placed on the table in supine position taking care to pad all bony prominences. After a preincinduction time out anesthesia was induced. The left upper extremity was prepped and draped in normal sterile fashion and a pre-incision timeout was performed. He received ancef for preoperative antibiotics.   He was placed in the beachchair position again padding all bony prominences.  I then made a longitudinal incision over his AC joint.  I created full tissue skin flaps and then made a separate incision through his joint capsule.  I protected this for later repair I then marked the clavicle at our resection site I placed retractors around this to protect the tissue below and used an oscillating blade to remove the distal  clavicle.  I beveled the ends confirmed appropriate distal clavicle resection I then thoroughly irrigated his joint I closed the joint capsule around this followed by the soft tissue in layers.  He was awoken and taken the PACU in stable condition  POST OPERATIVE PLAN: Sling for comfort mobilize for DVT prophylaxis

## 2019-03-17 ENCOUNTER — Other Ambulatory Visit (HOSPITAL_COMMUNITY): Payer: Self-pay | Admitting: Orthopedic Surgery

## 2019-03-17 ENCOUNTER — Other Ambulatory Visit: Payer: Self-pay | Admitting: Orthopedic Surgery

## 2019-03-17 DIAGNOSIS — T84033A Mechanical loosening of internal left knee prosthetic joint, initial encounter: Secondary | ICD-10-CM

## 2019-04-19 ENCOUNTER — Encounter (HOSPITAL_COMMUNITY)
Admission: RE | Admit: 2019-04-19 | Discharge: 2019-04-19 | Disposition: A | Discharge status: Home / Self Care | Payer: BC Managed Care – PPO | Source: Ambulatory Visit | Attending: Orthopedic Surgery | Admitting: Orthopedic Surgery

## 2019-04-19 ENCOUNTER — Ambulatory Visit (HOSPITAL_COMMUNITY)
Admission: RE | Admit: 2019-04-19 | Discharge: 2019-04-19 | Disposition: A | Discharge status: Home / Self Care | Payer: BC Managed Care – PPO | Source: Ambulatory Visit | Attending: Orthopedic Surgery | Admitting: Orthopedic Surgery

## 2019-04-19 ENCOUNTER — Other Ambulatory Visit: Payer: Self-pay

## 2019-04-19 DIAGNOSIS — T84033A Mechanical loosening of internal left knee prosthetic joint, initial encounter: Secondary | ICD-10-CM | POA: Insufficient documentation

## 2019-04-19 MED ORDER — TECHNETIUM TC 99M MEDRONATE IV KIT
20.0000 | PACK | Freq: Once | INTRAVENOUS | Status: AC | PRN
  Administered 2019-04-19: 20 via INTRAVENOUS

## 2019-05-15 ENCOUNTER — Other Ambulatory Visit: Payer: Self-pay | Admitting: Gastroenterology

## 2019-05-15 DIAGNOSIS — R112 Nausea with vomiting, unspecified: Secondary | ICD-10-CM

## 2019-05-27 ENCOUNTER — Ambulatory Visit
Admission: RE | Admit: 2019-05-27 | Discharge: 2019-05-27 | Disposition: A | Discharge status: Home / Self Care | Payer: BC Managed Care – PPO | Source: Ambulatory Visit | Attending: Gastroenterology | Admitting: Gastroenterology

## 2019-05-27 DIAGNOSIS — R112 Nausea with vomiting, unspecified: Secondary | ICD-10-CM

## 2019-05-31 ENCOUNTER — Other Ambulatory Visit: Payer: Self-pay | Admitting: Gastroenterology

## 2019-05-31 ENCOUNTER — Other Ambulatory Visit (HOSPITAL_COMMUNITY): Payer: Self-pay | Admitting: Gastroenterology

## 2019-05-31 DIAGNOSIS — R112 Nausea with vomiting, unspecified: Secondary | ICD-10-CM

## 2019-06-14 ENCOUNTER — Ambulatory Visit (HOSPITAL_COMMUNITY)
Admission: RE | Admit: 2019-06-14 | Discharge: 2019-06-14 | Disposition: A | Discharge status: Home / Self Care | Payer: BC Managed Care – PPO | Source: Ambulatory Visit | Attending: Gastroenterology | Admitting: Gastroenterology

## 2019-06-14 ENCOUNTER — Other Ambulatory Visit: Payer: Self-pay

## 2019-06-14 DIAGNOSIS — R112 Nausea with vomiting, unspecified: Secondary | ICD-10-CM | POA: Diagnosis present

## 2019-06-14 MED ORDER — TECHNETIUM TC 99M MEBROFENIN IV KIT
5.1200 | PACK | Freq: Once | INTRAVENOUS | Status: AC | PRN
  Administered 2019-06-14: 5.12 via INTRAVENOUS

## 2021-01-30 IMAGING — NM NM BONE 3 PHASE
10 series · 20 of 20 positions shown · non-contrast
Comparison: None

Radiographic correlation: None

CLINICAL DATA: LEFT knee in constant pain, intermittently RIGHT
knee hurts, history of partial LEFT knee replacement November 2014

EXAM:
NUCLEAR MEDICINE 3-PHASE BONE SCAN
TECHNIQUE: Radionuclide angiographic images, immediate static blood pool
images, and 3-hour delayed static images were obtained of the knees
after intravenous injection of radiopharmaceutical.
RADIOPHARMACEUTICALS:  21.2 mCi Nc-EEm MDP IV

[Series 1: flow · 2.07mm/px · 6 of 48 frames shown (1 of 2)]
[frame 5/48]
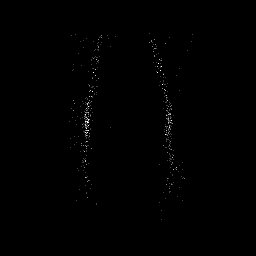
[frame 13/48  full-range]
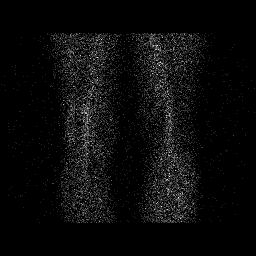
[frame 21/48  full-range]
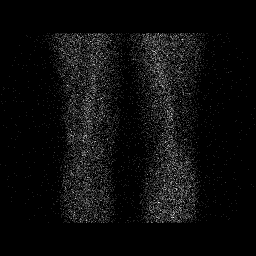
[frame 29/48  full-range]
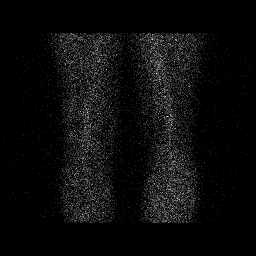
[frame 37/48  full-range]
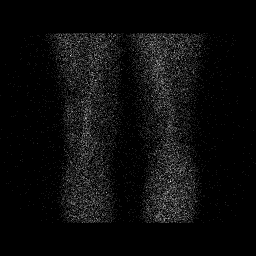
[frame 45/48  full-range]
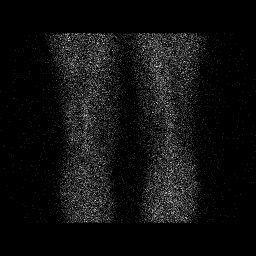

[Series 1: flow · 2.07mm/px · 6 of 48 frames shown (2 of 2)]
[frame 5/48]
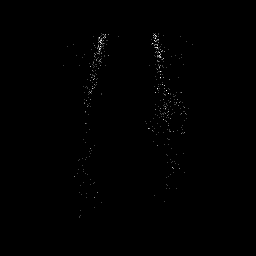
[frame 13/48  full-range]
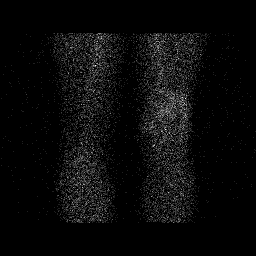
[frame 21/48  full-range]
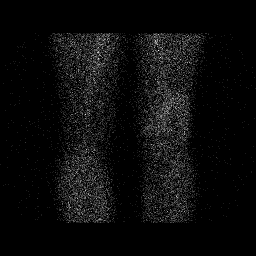
[frame 29/48  full-range]
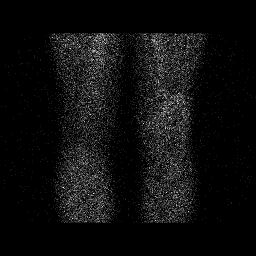
[frame 37/48  full-range]
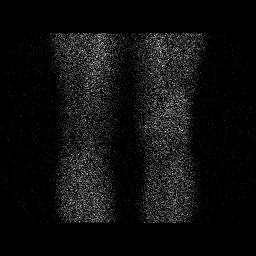
[frame 45/48  full-range]
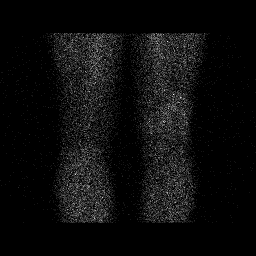

[Series 2: blood pool · 2.07mm/px · 1 of 1 slices shown (1 of 2)]
[im 1/1  full-range]
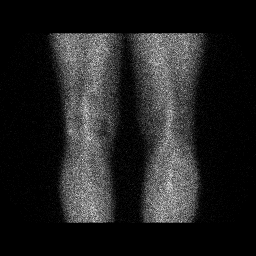

[Series 2: blood pool · 2.07mm/px · 1 of 1 slices shown (2 of 2)]
[im 1/1  full-range]
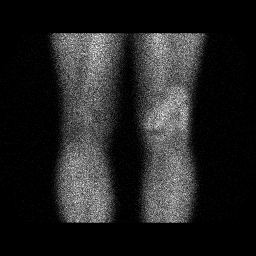

[Series 3: lat bp · 2.07mm/px · 1 of 1 slices shown (1 of 2)]
[im 1/1  full-range]
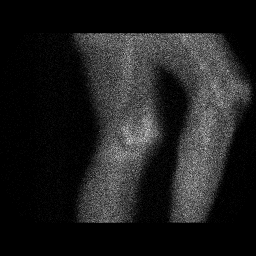

[Series 3: lat bp · 2.07mm/px · 1 of 1 slices shown (2 of 2)]
[im 1/1  full-range]
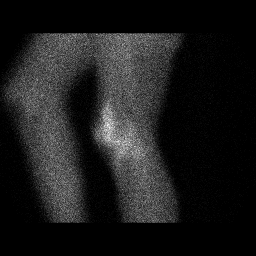

[Series 4: delay · delayed · 2.07mm/px · 1 of 1 slices shown (1 of 4)]
[im 1/1  full-range]
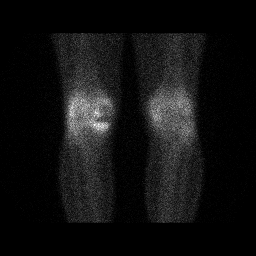

[Series 4: delay · delayed · 2.07mm/px · 1 of 1 slices shown (2 of 4)]
[im 1/1]
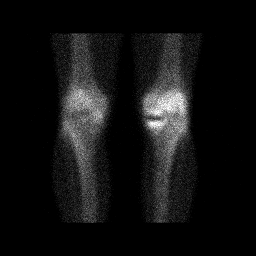

[Series 5: delay · delayed · 2.07mm/px · 1 of 1 slices shown (3 of 4)]
[im 1/1]
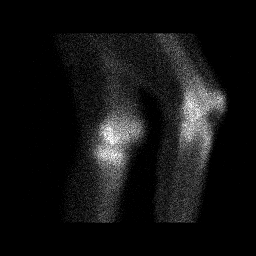

[Series 5: delay · delayed · 2.07mm/px · 1 of 1 slices shown (4 of 4)]
[im 1/1]
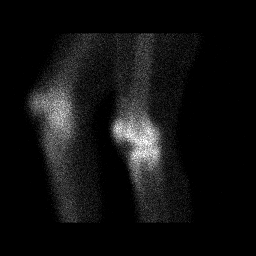

[20 of 20 positions shown; findings below may reference images not displayed]

FINDINGS: Vascular phase: Diffusely increased blood flow to the periarticular
regions of the LEFT knee. Normal blood flow to RIGHT knee.

Blood pool phase: Diffusely increased blood pool in the
periarticular regions of LEFT knee. Normal blood pool at RIGHT knee.

Delayed phase: Photopenic defect at medial compartment LEFT knee
likely representing a unicompartmental joint prosthesis. Focally
increased tracer accumulation is seen within the medial tibial
plateau adjacent to the prosthetic component which could represent
aseptic loosening or infection of the prosthesis. No significant
abnormal tracer uptake at the LEFT medial femoral condyle. Increased
tracer uptake at the medial compartment of the RIGHT knee typically
degenerative.
IMPRESSION: Increased blood flow and blood pool to the periarticular regions of
the LEFT knee consistent with hyperemia and synovitis.

Focally increased tracer accumulation in medial LEFT tibial plateau
adjacent to apparent unicompartmental prosthesis either representing
aseptic loosening or infection of the prosthesis.

Mild degenerative type uptake of tracer at medial compartment RIGHT
knee.

## 2023-09-16 ENCOUNTER — Encounter (HOSPITAL_BASED_OUTPATIENT_CLINIC_OR_DEPARTMENT_OTHER): Payer: Self-pay

## 2023-09-16 DIAGNOSIS — E119 Type 2 diabetes mellitus without complications: Secondary | ICD-10-CM | POA: Diagnosis not present

## 2023-09-16 DIAGNOSIS — Z87891 Personal history of nicotine dependence: Secondary | ICD-10-CM | POA: Diagnosis not present

## 2023-09-16 DIAGNOSIS — J45909 Unspecified asthma, uncomplicated: Secondary | ICD-10-CM | POA: Diagnosis not present

## 2023-09-16 DIAGNOSIS — R112 Nausea with vomiting, unspecified: Secondary | ICD-10-CM | POA: Diagnosis not present

## 2023-09-16 DIAGNOSIS — R1030 Lower abdominal pain, unspecified: Secondary | ICD-10-CM | POA: Insufficient documentation

## 2023-09-16 DIAGNOSIS — R197 Diarrhea, unspecified: Secondary | ICD-10-CM | POA: Diagnosis not present

## 2023-09-16 DIAGNOSIS — R748 Abnormal levels of other serum enzymes: Secondary | ICD-10-CM | POA: Insufficient documentation

## 2023-09-16 LAB — COMPREHENSIVE METABOLIC PANEL WITH GFR
ALT: 23 U/L (ref 0–44)
AST: 27 U/L (ref 15–41)
Albumin: 4.4 g/dL (ref 3.5–5.0)
Alkaline Phosphatase: 82 U/L (ref 38–126)
Anion gap: 17 — ABNORMAL HIGH (ref 5–15)
BUN: 15 mg/dL (ref 6–20)
CO2: 21 mmol/L — ABNORMAL LOW (ref 22–32)
Calcium: 10.1 mg/dL (ref 8.9–10.3)
Chloride: 102 mmol/L (ref 98–111)
Creatinine, Ser: 0.97 mg/dL (ref 0.61–1.24)
GFR, Estimated: >60 mL/min (ref >60)
Glucose, Bld: 174 mg/dL — ABNORMAL HIGH (ref 70–99)
Potassium: 4.2 mmol/L (ref 3.5–5.1)
Sodium: 140 mmol/L (ref 135–145)
Total Bilirubin: 0.8 mg/dL (ref 0.0–1.2)
Total Protein: 7.9 g/dL (ref 6.5–8.1)

## 2023-09-16 LAB — URINALYSIS, ROUTINE W REFLEX MICROSCOPIC
Bacteria, UA: NONE SEEN
Bilirubin Urine: NEGATIVE
Glucose, UA: >1000 mg/dL — AB
Hgb urine dipstick: NEGATIVE
Ketones, ur: 15 mg/dL — AB
Leukocytes,Ua: NEGATIVE
Nitrite: NEGATIVE
Protein, ur: NEGATIVE mg/dL
Specific Gravity, Urine: 1.035 — ABNORMAL HIGH (ref 1.005–1.030)
pH: 5.5 (ref 5.0–8.0)

## 2023-09-16 LAB — CBC WITH DIFFERENTIAL/PLATELET
Abs Immature Granulocytes: 0.04 10*3/uL (ref 0.00–0.07)
Basophils Absolute: 0 10*3/uL (ref 0.0–0.1)
Basophils Relative: 0 %
Eosinophils Absolute: 0.5 10*3/uL (ref 0.0–0.5)
Eosinophils Relative: 4 %
HCT: 50.8 % (ref 39.0–52.0)
Hemoglobin: 17.1 g/dL — ABNORMAL HIGH (ref 13.0–17.0)
Immature Granulocytes: 0 %
Lymphocytes Relative: 10 %
Lymphs Abs: 1.2 10*3/uL (ref 0.7–4.0)
MCH: 29.5 pg (ref 26.0–34.0)
MCHC: 33.7 g/dL (ref 30.0–36.0)
MCV: 87.6 fL (ref 80.0–100.0)
Monocytes Absolute: 0.7 10*3/uL (ref 0.1–1.0)
Monocytes Relative: 6 %
Neutro Abs: 10.4 10*3/uL — ABNORMAL HIGH (ref 1.7–7.7)
Neutrophils Relative %: 80 %
Platelets: 171 10*3/uL (ref 150–400)
RBC: 5.8 MIL/uL (ref 4.22–5.81)
RDW: 13.4 % (ref 11.5–15.5)
WBC: 13 10*3/uL — ABNORMAL HIGH (ref 4.0–10.5)
nRBC: 0 % (ref 0.0–0.2)

## 2023-09-16 LAB — LACTIC ACID, PLASMA: Lactic Acid, Venous: 1.4 mmol/L (ref 0.5–1.9)

## 2023-09-16 LAB — LIPASE, BLOOD: Lipase: 70 U/L — ABNORMAL HIGH (ref 11–51)

## 2023-09-16 MED ORDER — ONDANSETRON HCL 4 MG/2ML IJ SOLN
4.0000 mg | Freq: Once | INTRAMUSCULAR | Status: AC | PRN
  Administered 2023-09-16: 4 mg via INTRAVENOUS
  Filled 2023-09-16: qty 2

## 2023-09-16 NOTE — ED Notes (Signed)
 X1 set of blood cultures sent w/ bloodwork

## 2023-09-16 NOTE — ED Triage Notes (Signed)
 Pt reports dental appt for cleaning today, hx knee surgery 2021 so has to take Amoxicillin for appts. Took amoxicillin 330pm, began +N/+V/+D ~430pm. x5 emesis occurrences since. C/o dry mouth, bilateral lower abd pain/tenderness

## 2023-09-17 ENCOUNTER — Telehealth (HOSPITAL_BASED_OUTPATIENT_CLINIC_OR_DEPARTMENT_OTHER): Payer: Self-pay | Admitting: Emergency Medicine

## 2023-09-17 ENCOUNTER — Telehealth (HOSPITAL_BASED_OUTPATIENT_CLINIC_OR_DEPARTMENT_OTHER): Payer: Self-pay

## 2023-09-17 ENCOUNTER — Emergency Department (HOSPITAL_BASED_OUTPATIENT_CLINIC_OR_DEPARTMENT_OTHER)
Admission: EM | Admit: 2023-09-17 | Discharge: 2023-09-17 | Disposition: A | Discharge status: Home / Self Care | Attending: Emergency Medicine | Admitting: Emergency Medicine

## 2023-09-17 ENCOUNTER — Other Ambulatory Visit: Payer: Self-pay

## 2023-09-17 DIAGNOSIS — R112 Nausea with vomiting, unspecified: Secondary | ICD-10-CM

## 2023-09-17 DIAGNOSIS — R1030 Lower abdominal pain, unspecified: Secondary | ICD-10-CM

## 2023-09-17 MED ORDER — DICYCLOMINE HCL 20 MG PO TABS: 20.0000 mg | ORAL_TABLET | Freq: Two times a day (BID) | ORAL | 0 refills | Status: DC

## 2023-09-17 MED ORDER — ONDANSETRON 4 MG PO TBDP: 4.0000 mg | ORAL_TABLET | Freq: Three times a day (TID) | ORAL | 0 refills | Status: DC | PRN

## 2023-09-17 MED ORDER — ONDANSETRON 4 MG PO TBDP
4.0000 mg | ORAL_TABLET | Freq: Three times a day (TID) | ORAL | 0 refills | Status: DC | PRN
Start: 2023-09-17 — End: 2023-09-17

## 2023-09-17 MED ORDER — PROMETHAZINE HCL 25 MG PO TABS
25.0000 mg | ORAL_TABLET | Freq: Once | ORAL | Status: AC
  Administered 2023-09-17: 25 mg via ORAL
  Filled 2023-09-17: qty 1

## 2023-09-17 MED ORDER — ONDANSETRON 4 MG PO TBDP: 4.0000 mg | ORAL_TABLET | Freq: Three times a day (TID) | ORAL | 0 refills | Status: AC | PRN

## 2023-09-17 MED ORDER — DICYCLOMINE HCL 20 MG PO TABS: 20.0000 mg | ORAL_TABLET | Freq: Two times a day (BID) | ORAL | 0 refills | Status: AC

## 2023-09-17 MED ORDER — LOPERAMIDE HCL 2 MG PO CAPS
2.0000 mg | ORAL_CAPSULE | Freq: Four times a day (QID) | ORAL | 0 refills | Status: DC | PRN
Start: 2023-09-17 — End: 2023-09-17

## 2023-09-17 MED ORDER — LOPERAMIDE HCL 2 MG PO CAPS: 2.0000 mg | ORAL_CAPSULE | Freq: Four times a day (QID) | ORAL | 0 refills | Status: DC | PRN

## 2023-09-17 MED ORDER — LOPERAMIDE HCL 2 MG PO CAPS: 2.0000 mg | ORAL_CAPSULE | Freq: Four times a day (QID) | ORAL | 0 refills | Status: AC | PRN

## 2023-09-17 NOTE — Telephone Encounter (Signed)
 Patient reportedly called that his prescriptions were not at the [facility].  Will try to resend the prescriptions ordered overnight.

## 2023-09-17 NOTE — ED Provider Notes (Signed)
 [facility] Emergency Department Provider Note MRN:  [id number]  Arrival date & time: 09/17/23     Chief Complaint   Abdominal Pain and Emesis   History of Present Illness   Wesley Castillo is a 58 y.o. year-old male with a history of diabetes presenting to the ED with chief complaint of abdominal pain.  Began having nausea vomiting this morning which persisted.  5+ episodes of vomiting with diarrhea, lower abdominal cramping pain.  Overall feeling better over the past couple hours.  Denies fever.  Review of Systems  A thorough review of systems was obtained and all systems are negative except as noted in the HPI and PMH.   Patient's Health History    Past Medical History:  Diagnosis Date   Arthritis    bil knees   Asthma    asthma induced   Complication of anesthesia    prolonged emergence   Diabetes mellitus without complication (HCC)    GERD (gastroesophageal reflux disease)    Left shoulder pain    Seasonal allergies     Past Surgical History:  Procedure Laterality Date   KNEE ARTHROSCOPY Bilateral    left x3, right x1   NASAL SEPTUM SURGERY     PARTIAL KNEE ARTHROPLASTY Left 11/17/2014   Procedure: LEFT KNEE UNI ARTHROPLASTY ;  Surgeon: Ferd Householder, MD;  Location: [facility];  Service: Orthopedics;  Laterality: Left;   RESECTION DISTAL CLAVICAL Left 09/25/2018   Procedure: OPEN DISTAL CLAVICLE EXCISION;  Surgeon: Saundra Curl, MD;  Location: [facility];  Service: Orthopedics;  Laterality: Left;    History reviewed. No pertinent family history.  Social History   Socioeconomic History   Marital status: Married    Spouse name: Not on file   Number of children: Not on file   Years of education: Not on file   Highest education level: Not on file  Occupational History   Not on file  Tobacco Use   Smoking status: Former    Types: Cigars   Smokeless tobacco: Never  Substance and Sexual Activity    Alcohol use: Yes    Comment: social   Drug use: Never   Sexual activity: Yes  Other Topics Concern   Not on file  Social History Narrative   Not on file   Social Drivers of Health   Financial Resource Strain: Not on file  Food Insecurity: Low Risk  (08/28/2023)   Received from [facility]   Hunger Vital Sign    Worried About Running Out of Food in the Last Year: Never true    Ran Out of Food in the Last Year: Never true  Transportation Needs: No Transportation Needs (08/28/2023)   Received from [facility]    In the past 12 months, has lack of reliable transportation kept you from medical appointments, meetings, work or from getting things needed for daily living? : No  Physical Activity: Not on file  Stress: Not on file  Social Connections: Unknown (08/24/2021)   Received from [facility]   Social Network    Social Network: Not on file  Intimate Partner Violence: Unknown (07/17/2021)   Received from [facility]   HITS    Physically Hurt: Not on file    Insult or Talk Down To: Not on file    Threaten Physical Harm: Not on file    Scream or Curse: Not on file     Physical Exam  Vitals:   09/16/23 2136 09/17/23 0152  BP: (!) 126/91 107/77  Pulse: 95 70  Resp: (!) 22 16  Temp: 98.5 F (36.9 C) 98.1 F (36.7 C)  SpO2: 95% 95%    CONSTITUTIONAL: Well-appearing, NAD NEURO/PSYCH:  Alert and oriented x 3, no focal deficits EYES:  eyes equal and reactive ENT/NECK:  no LAD, no JVD CARDIO: Regular rate, well-perfused, normal S1 and S2 PULM:  CTAB no wheezing or rhonchi GI/GU:  non-distended, non-tender MSK/SPINE:  No gross deformities, no edema SKIN:  no rash, atraumatic   *Additional and/or pertinent findings included in MDM below  Diagnostic and Interventional Summary    EKG Interpretation Date/Time:  Tuesday September 16 2023 21:54:28 EDT Ventricular Rate:  91 PR Interval:  166 QRS Duration:  81 QT Interval:  315 QTC Calculation: 388 R  Axis:   59  Text Interpretation: Sinus rhythm Nonspecific repol abnormality, diffuse leads No significant change since last tracing Confirmed by Trish Furl [phone]) on 09/16/2023 9:59:50 PM       Labs Reviewed  LIPASE, BLOOD - Abnormal; Notable for the following components:      Result Value   Lipase 70 (*)    All other components within normal limits  COMPREHENSIVE METABOLIC PANEL WITH GFR - Abnormal; Notable for the following components:   CO2 21 (*)    Glucose, Bld 174 (*)    Anion gap 17 (*)    All other components within normal limits  URINALYSIS, ROUTINE W REFLEX MICROSCOPIC - Abnormal; Notable for the following components:   Specific Gravity, Urine 1.035 (*)    Glucose, UA >1,000 (*)    Ketones, ur 15 (*)    All other components within normal limits  CBC WITH DIFFERENTIAL/PLATELET - Abnormal; Notable for the following components:   WBC 13.0 (*)    Hemoglobin 17.1 (*)    Neutro Abs 10.4 (*)    All other components within normal limits  LACTIC ACID, PLASMA    No orders to display    Medications  ondansetron  (ZOFRAN ) injection 4 mg (4 mg Intravenous Given 09/16/23 2200)  promethazine  (PHENERGAN ) tablet 25 mg (25 mg Oral Given 09/17/23 0151)     Procedures  /  Critical Care Procedures  ED Course and Medical Decision Making  Initial Impression and Ddx Well-appearing in no acute distress, normal vitals.  Feeling a lot better than earlier.  Abdomen is completely soft and nontender with no rebound guarding or rigidity, no focal tenderness, specifically no McBurney's point tenderness.  Symptoms would suggest a gastroenteritis as the most likely explanation.  Appendicitis considered but felt to be much less likely.  Shared decision making utilized and CT scan offered, but patient feels comfortable avoiding CT scan at this time and treating symptoms at home.  Past medical/surgical history that increases complexity of ED encounter: Diabetes  Interpretation of Diagnostics I  personally reviewed the Laboratory Testing and my interpretation is as follows: No significant blood count or electrolyte disturbance.  Minimal lipase elevation nonspecific some evidence of hemoconcentration.  Likely mild dehydration    Patient Reassessment and Ultimate Disposition/Management     Discharge  Patient management required discussion with the following services or consulting groups:  None  Complexity of Problems Addressed Acute illness or injury that poses threat of life of bodily function  Additional Data Reviewed and Analyzed Further history obtained from: Further history from spouse/family member  Additional Factors Impacting ED Encounter Risk Prescriptions  Merrick Abe. Harless Lien, MD [facility] Emergency Medicine [facility]  [facility] [email or web address]  Final Clinical Impressions(s) / ED Diagnoses     ICD-10-CM   1. Lower abdominal pain  R10.30     2. Nausea vomiting and diarrhea  R11.2    R19.7       ED Discharge Orders          Ordered    ondansetron  (ZOFRAN -ODT) 4 MG disintegrating tablet  Every 8 hours PRN        09/17/23 0155    loperamide (IMODIUM) 2 MG capsule  4 times daily PRN        09/17/23 0155    dicyclomine (BENTYL) 20 MG tablet  2 times daily        09/17/23 0155             Discharge Instructions Discussed with and Provided to Patient:     Discharge Instructions      You were evaluated in the Emergency Department and after careful evaluation, we did not find any emergent condition requiring admission or further testing in the hospital.  Your exam/testing today is overall reassuring.  Symptoms most likely due to gastroenteritis.  Use the Zofran  as needed for nausea, use the Bentyl as needed for abdominal pain, use the Imodium as needed for diarrhea.  Plenty of fluids and rest.  Please return to the Emergency Department if you experience any worsening of your condition.   Thank you for allowing us  to be a part of your  care.      Edson Graces, MD 09/17/23 [phone]

## 2023-09-17 NOTE — Discharge Instructions (Signed)
 You were evaluated in the Emergency Department and after careful evaluation, we did not find any emergent condition requiring admission or further testing in the hospital.  Your exam/testing today is overall reassuring.  Symptoms most likely due to gastroenteritis.  Use the Zofran  as needed for nausea, use the Bentyl as needed for abdominal pain, use the Imodium as needed for diarrhea.  Plenty of fluids and rest.  Please return to the Emergency Department if you experience any worsening of your condition.   Thank you for allowing us  to be a part of your care.
